# Patient Record
Sex: Male | Born: 1935 | Race: White | Hispanic: No | Marital: Married | Smoking: Former smoker
Health system: Southern US, Community
[De-identification: ages and names within clinical notes are randomized; demographics above are authoritative.]

## PROBLEM LIST (undated history)

## (undated) DIAGNOSIS — G309 Alzheimer's disease, unspecified: Secondary | ICD-10-CM

## (undated) DIAGNOSIS — F0280 Dementia in other diseases classified elsewhere without behavioral disturbance: Secondary | ICD-10-CM

## (undated) DIAGNOSIS — F0390 Unspecified dementia without behavioral disturbance: Secondary | ICD-10-CM

## (undated) DIAGNOSIS — I10 Essential (primary) hypertension: Secondary | ICD-10-CM

## (undated) DIAGNOSIS — A0472 Enterocolitis due to Clostridium difficile, not specified as recurrent: Secondary | ICD-10-CM

## (undated) DIAGNOSIS — K5790 Diverticulosis of intestine, part unspecified, without perforation or abscess without bleeding: Secondary | ICD-10-CM

## (undated) DIAGNOSIS — E7800 Pure hypercholesterolemia, unspecified: Secondary | ICD-10-CM

## (undated) DIAGNOSIS — I2510 Atherosclerotic heart disease of native coronary artery without angina pectoris: Secondary | ICD-10-CM

## (undated) HISTORY — DX: Essential (primary) hypertension: I10

## (undated) HISTORY — PX: CHOLECYSTECTOMY: SHX55

## (undated) HISTORY — PX: TOTAL HIP ARTHROPLASTY: SHX124

## (undated) HISTORY — DX: Atherosclerotic heart disease of native coronary artery without angina pectoris: I25.10

## (undated) HISTORY — DX: Pure hypercholesterolemia, unspecified: E78.00

## (undated) HISTORY — DX: Enterocolitis due to Clostridium difficile, not specified as recurrent: A04.72

## (undated) HISTORY — DX: Unspecified dementia, unspecified severity, without behavioral disturbance, psychotic disturbance, mood disturbance, and anxiety: F03.90

## (undated) HISTORY — DX: Dementia in other diseases classified elsewhere, unspecified severity, without behavioral disturbance, psychotic disturbance, mood disturbance, and anxiety: F02.80

## (undated) HISTORY — DX: Diverticulosis of intestine, part unspecified, without perforation or abscess without bleeding: K57.90

## (undated) HISTORY — DX: Alzheimer's disease, unspecified: G30.9

---

## 2000-11-25 HISTORY — PX: CORONARY ARTERY BYPASS GRAFT: SHX141

## 2000-12-08 ENCOUNTER — Encounter: Payer: Self-pay | Admitting: Surgery

## 2000-12-08 ENCOUNTER — Inpatient Hospital Stay (HOSPITAL_COMMUNITY): Admission: EM | Admit: 2000-12-08 | Discharge: 2000-12-14 | Payer: Self-pay | Admitting: *Deleted

## 2000-12-08 ENCOUNTER — Encounter: Payer: Self-pay | Admitting: *Deleted

## 2000-12-09 ENCOUNTER — Encounter: Payer: Self-pay | Admitting: Cardiothoracic Surgery

## 2000-12-10 ENCOUNTER — Encounter: Payer: Self-pay | Admitting: Surgery

## 2000-12-11 ENCOUNTER — Encounter: Payer: Self-pay | Admitting: Cardiothoracic Surgery

## 2000-12-12 ENCOUNTER — Encounter: Payer: Self-pay | Admitting: Surgery

## 2000-12-26 ENCOUNTER — Inpatient Hospital Stay (HOSPITAL_COMMUNITY): Admission: AD | Admit: 2000-12-26 | Discharge: 2000-12-29 | Payer: Self-pay | Admitting: *Deleted

## 2000-12-26 ENCOUNTER — Encounter: Payer: Self-pay | Admitting: *Deleted

## 2001-09-05 ENCOUNTER — Encounter (HOSPITAL_COMMUNITY): Admission: RE | Admit: 2001-09-05 | Discharge: 2001-09-29 | Payer: Self-pay | Admitting: Psychiatry

## 2002-03-23 ENCOUNTER — Encounter: Admission: RE | Admit: 2002-03-23 | Discharge: 2002-06-21 | Payer: Self-pay

## 2002-03-26 ENCOUNTER — Ambulatory Visit (HOSPITAL_COMMUNITY): Admission: RE | Admit: 2002-03-26 | Discharge: 2002-03-26 | Payer: Self-pay

## 2002-11-07 ENCOUNTER — Ambulatory Visit (HOSPITAL_COMMUNITY): Admission: RE | Admit: 2002-11-07 | Discharge: 2002-11-07 | Payer: Self-pay | Admitting: Orthopedic Surgery

## 2002-11-07 ENCOUNTER — Encounter: Payer: Self-pay | Admitting: Orthopedic Surgery

## 2002-11-28 ENCOUNTER — Inpatient Hospital Stay (HOSPITAL_COMMUNITY): Admission: RE | Admit: 2002-11-28 | Discharge: 2002-12-02 | Payer: Self-pay | Admitting: Orthopedic Surgery

## 2002-11-28 ENCOUNTER — Encounter: Payer: Self-pay | Admitting: Orthopedic Surgery

## 2003-02-11 ENCOUNTER — Encounter: Admission: RE | Admit: 2003-02-11 | Discharge: 2003-02-11 | Payer: Self-pay | Admitting: Internal Medicine

## 2003-02-11 ENCOUNTER — Encounter: Payer: Self-pay | Admitting: Internal Medicine

## 2003-05-04 ENCOUNTER — Inpatient Hospital Stay (HOSPITAL_COMMUNITY): Admission: EM | Admit: 2003-05-04 | Discharge: 2003-05-06 | Payer: Self-pay | Admitting: Emergency Medicine

## 2003-05-05 ENCOUNTER — Encounter: Payer: Self-pay | Admitting: Internal Medicine

## 2003-10-05 ENCOUNTER — Emergency Department (HOSPITAL_COMMUNITY): Admission: EM | Admit: 2003-10-05 | Discharge: 2003-10-05 | Payer: Self-pay | Admitting: Emergency Medicine

## 2008-04-09 ENCOUNTER — Inpatient Hospital Stay (HOSPITAL_COMMUNITY): Admission: AD | Admit: 2008-04-09 | Discharge: 2008-04-14 | Payer: Self-pay | Admitting: Emergency Medicine

## 2008-04-10 ENCOUNTER — Encounter (INDEPENDENT_AMBULATORY_CARE_PROVIDER_SITE_OTHER): Payer: Self-pay | Admitting: Internal Medicine

## 2008-04-10 ENCOUNTER — Ambulatory Visit: Payer: Self-pay | Admitting: Vascular Surgery

## 2008-05-03 LAB — LIPID PANEL
LDL Cholesterol: 41 mg/dL
Triglycerides: 71 mg/dL (ref 40–160)

## 2009-02-17 ENCOUNTER — Inpatient Hospital Stay (HOSPITAL_COMMUNITY): Admission: EM | Admit: 2009-02-17 | Discharge: 2009-02-21 | Payer: Self-pay | Admitting: Emergency Medicine

## 2009-02-20 ENCOUNTER — Ambulatory Visit: Payer: Self-pay | Admitting: Physical Medicine & Rehabilitation

## 2009-02-21 ENCOUNTER — Inpatient Hospital Stay (HOSPITAL_COMMUNITY)
Admission: RE | Admit: 2009-02-21 | Discharge: 2009-03-03 | Payer: Self-pay | Admitting: Physical Medicine & Rehabilitation

## 2009-02-21 ENCOUNTER — Ambulatory Visit: Payer: Self-pay | Admitting: Physical Medicine & Rehabilitation

## 2009-03-10 ENCOUNTER — Ambulatory Visit: Payer: Self-pay | Admitting: Diagnostic Radiology

## 2009-03-10 ENCOUNTER — Emergency Department (HOSPITAL_BASED_OUTPATIENT_CLINIC_OR_DEPARTMENT_OTHER): Admission: EM | Admit: 2009-03-10 | Discharge: 2009-03-10 | Payer: Self-pay | Admitting: Emergency Medicine

## 2009-03-12 ENCOUNTER — Inpatient Hospital Stay (HOSPITAL_COMMUNITY): Admission: AD | Admit: 2009-03-12 | Discharge: 2009-03-25 | Payer: Self-pay | Admitting: Internal Medicine

## 2009-03-13 ENCOUNTER — Encounter (HOSPITAL_BASED_OUTPATIENT_CLINIC_OR_DEPARTMENT_OTHER): Payer: Self-pay | Admitting: Internal Medicine

## 2009-03-17 ENCOUNTER — Ambulatory Visit: Payer: Self-pay | Admitting: Internal Medicine

## 2009-04-21 ENCOUNTER — Inpatient Hospital Stay (HOSPITAL_COMMUNITY): Admission: AD | Admit: 2009-04-21 | Discharge: 2009-04-28 | Payer: Self-pay | Admitting: Internal Medicine

## 2010-01-07 ENCOUNTER — Encounter: Admission: RE | Admit: 2010-01-07 | Discharge: 2010-03-11 | Payer: Self-pay | Admitting: Internal Medicine

## 2010-07-01 IMAGING — US US RENAL
1 series · 14 of 21 positions shown · non-contrast
Comparison: None

CLINICAL DATA: 87-year-old with dehydration and diarrhea.

RENAL/URINARY TRACT ULTRASOUND COMPLETE

[Series 1: us renal · 0.32mm/px · 14 of 21 slices shown]
[im 1/21]
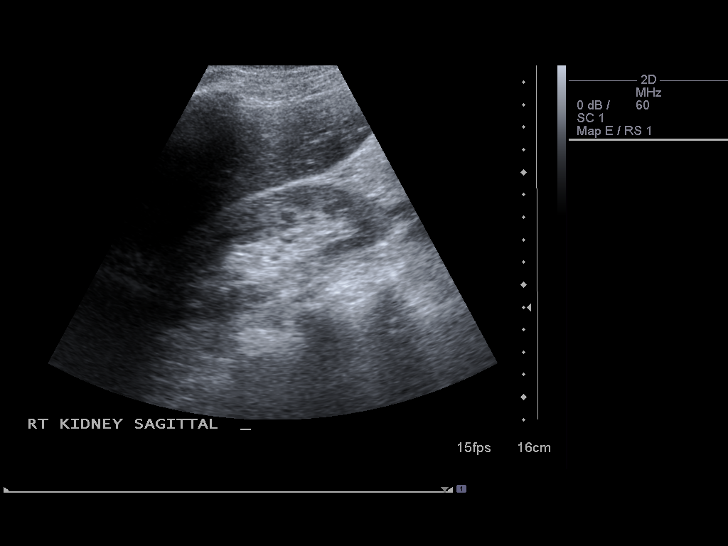
[im 3/21]
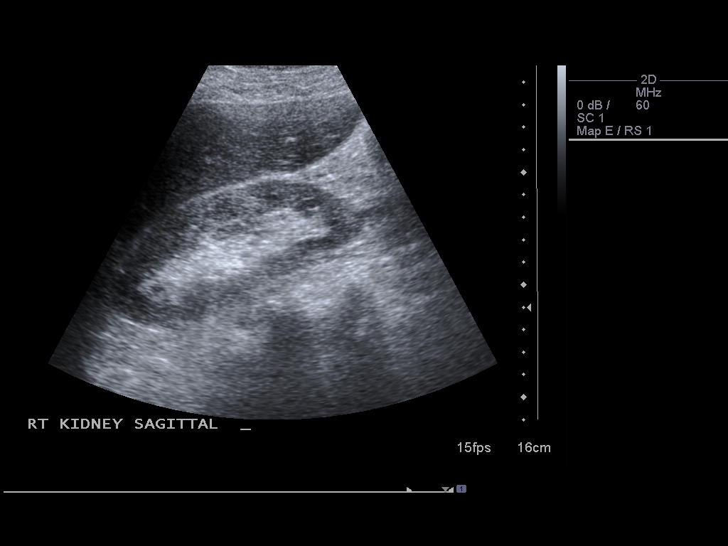
[im 4/21]
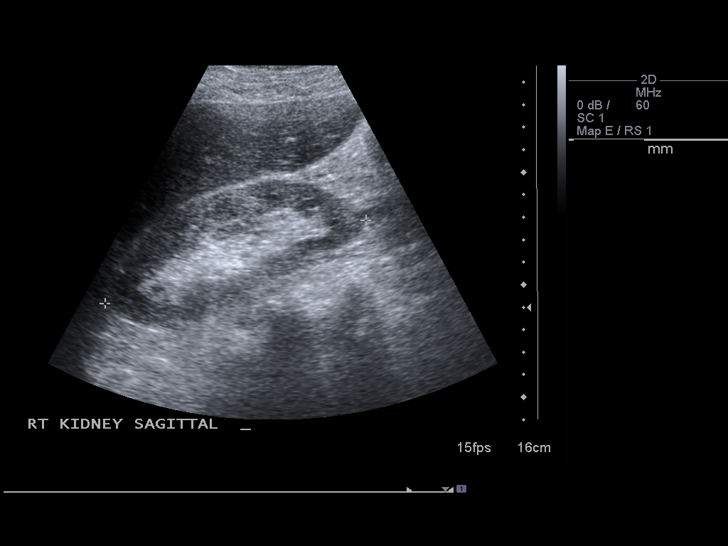
[im 6/21]
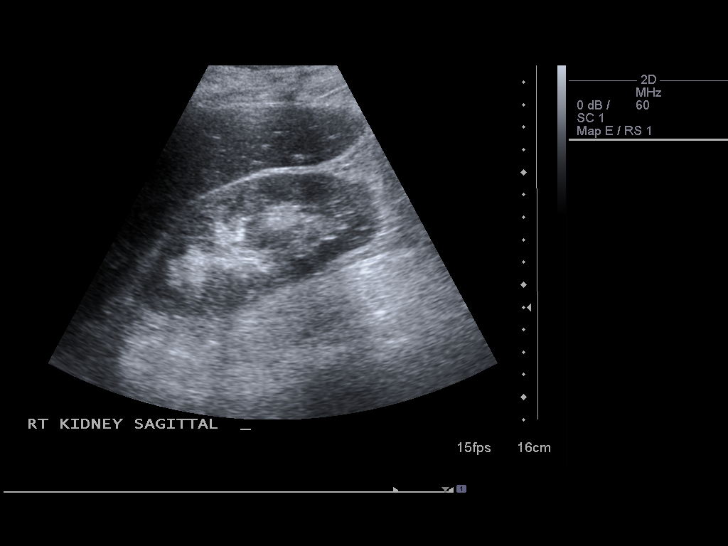
[im 7/21]
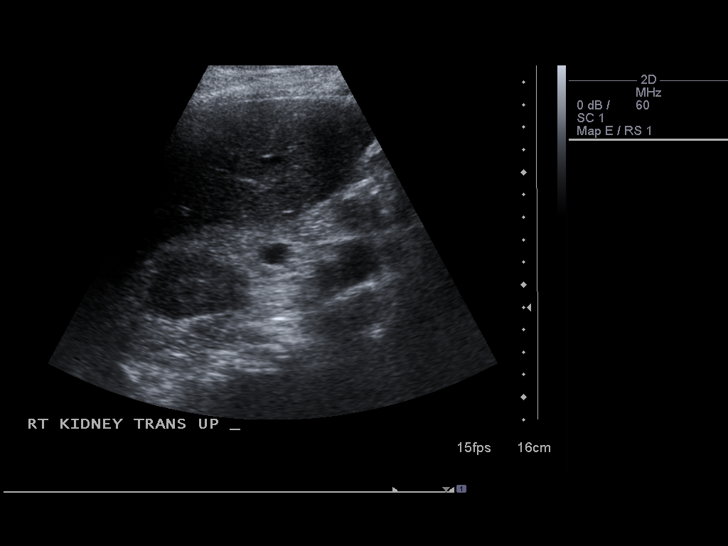
[im 9/21]
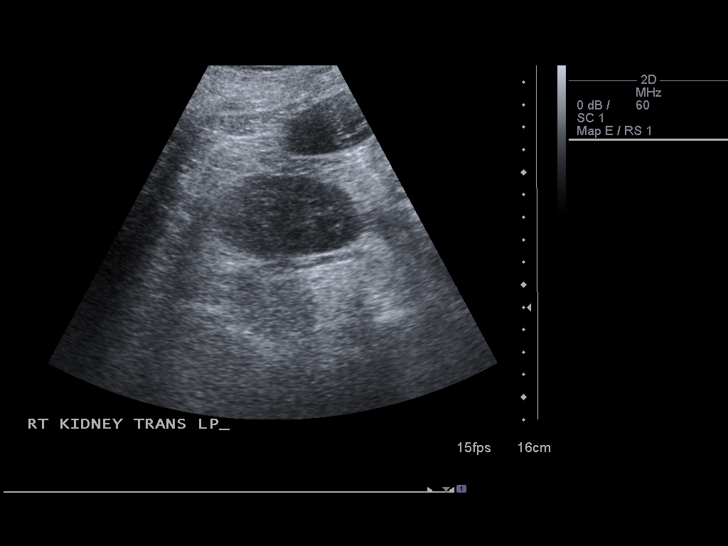
[im 10/21]
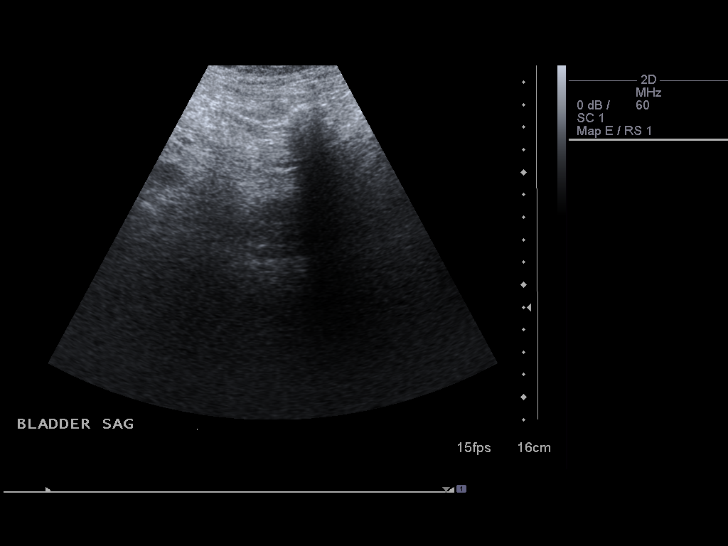
[im 12/21]
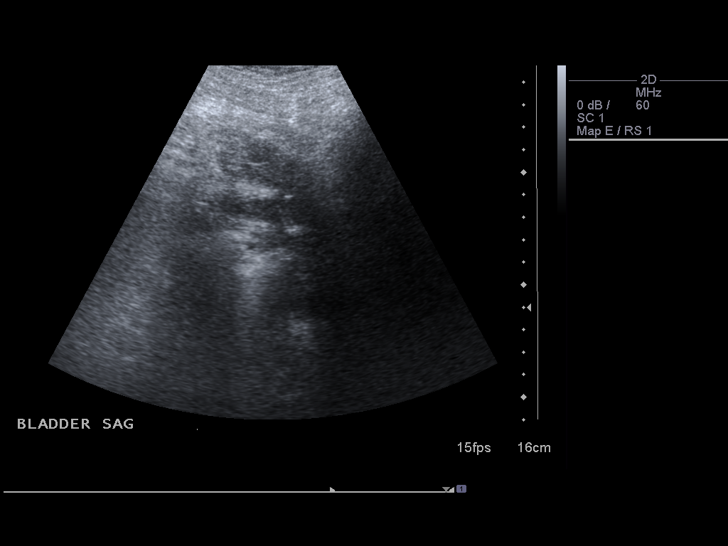
[im 13/21]
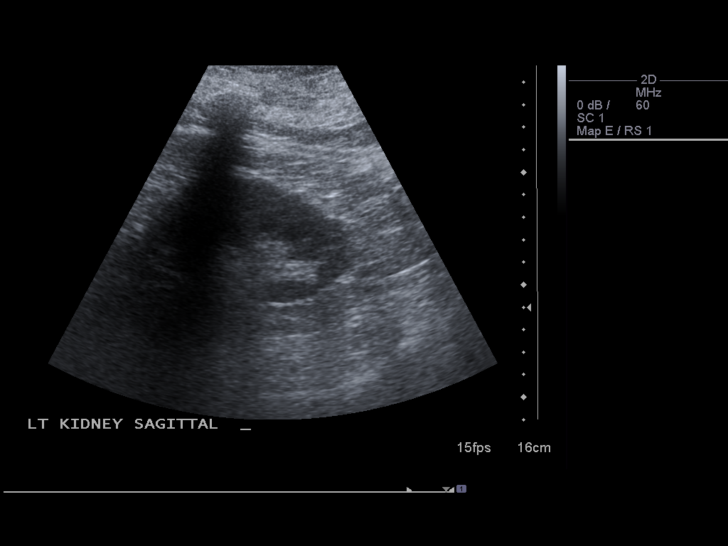
[im 15/21]
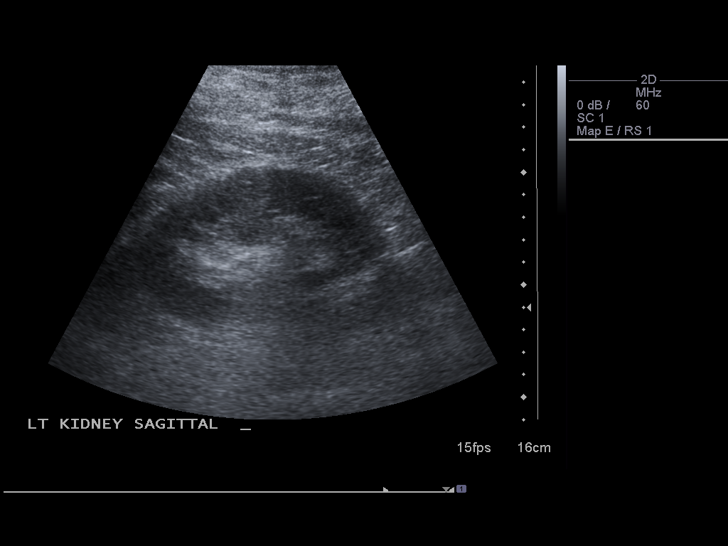
[im 16/21]
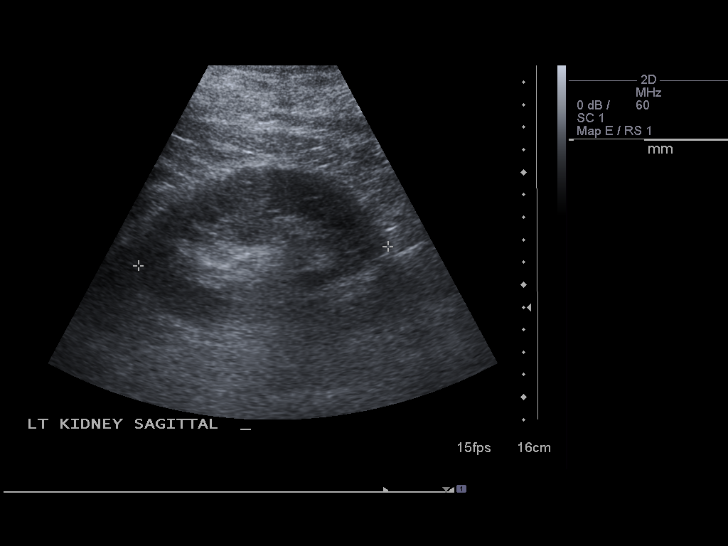
[im 18/21]
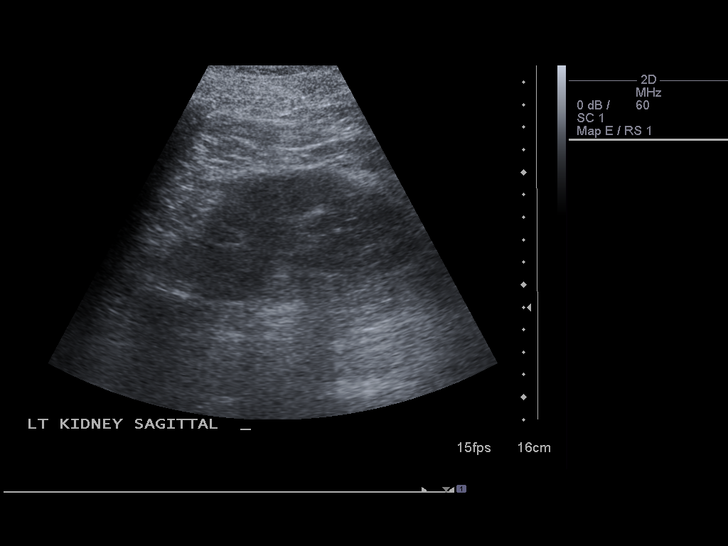
[im 19/21]
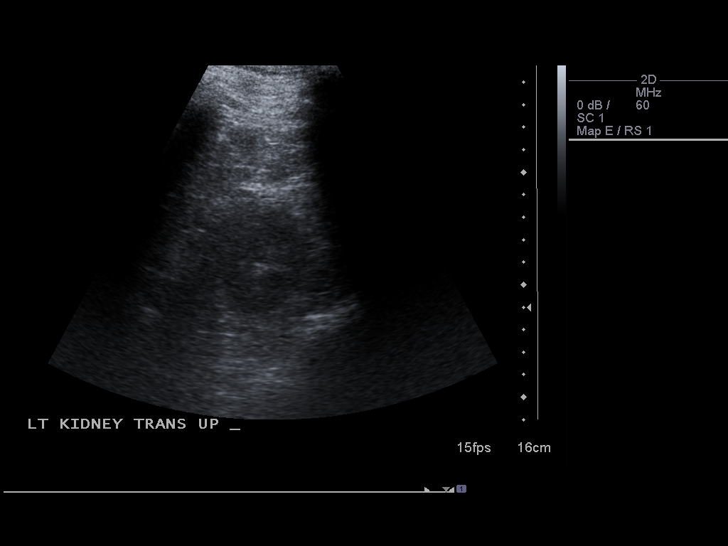
[im 21/21]
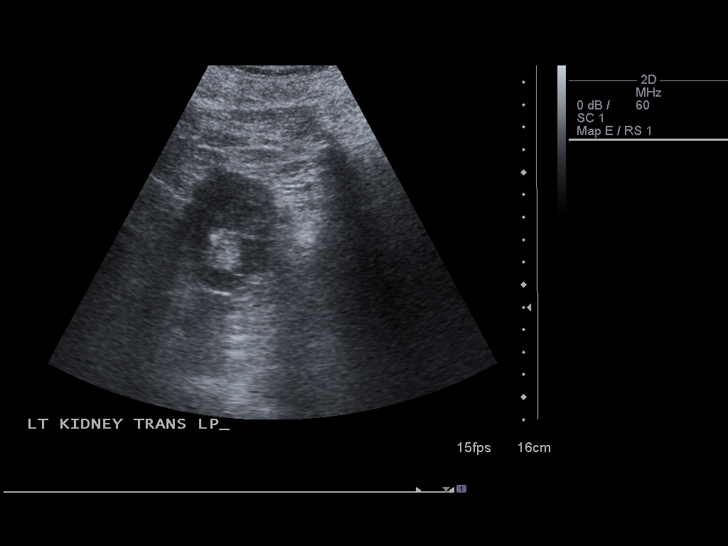

[14 of 21 positions shown; findings below may reference images not displayed]

FINDINGS: Right Kidney:  Right kidney measures 12.2 cm in length.
Echotexture of the right kidney is grossly normal with slight
enlargement of the central sinus complex.  There is no evidence for
hydronephrosis.

Left Kidney:  Left kidney has a normal appearance measuring 11.1 cm
length without hydronephrosis.

Bladder:  The bladder is decompressed.
IMPRESSION: Negative renal ultrasound.  No evidence for hydronephrosis.

## 2010-11-13 ENCOUNTER — Encounter (INDEPENDENT_AMBULATORY_CARE_PROVIDER_SITE_OTHER): Payer: Medicare Other | Admitting: Cardiology

## 2010-11-13 DIAGNOSIS — I2510 Atherosclerotic heart disease of native coronary artery without angina pectoris: Secondary | ICD-10-CM

## 2010-11-13 DIAGNOSIS — I10 Essential (primary) hypertension: Secondary | ICD-10-CM

## 2010-11-13 DIAGNOSIS — E7800 Pure hypercholesterolemia, unspecified: Secondary | ICD-10-CM

## 2010-12-02 ENCOUNTER — Encounter: Payer: Self-pay | Admitting: Cardiology

## 2010-12-02 DIAGNOSIS — K5790 Diverticulosis of intestine, part unspecified, without perforation or abscess without bleeding: Secondary | ICD-10-CM | POA: Insufficient documentation

## 2010-12-02 DIAGNOSIS — F0390 Unspecified dementia without behavioral disturbance: Secondary | ICD-10-CM | POA: Insufficient documentation

## 2010-12-02 DIAGNOSIS — I2510 Atherosclerotic heart disease of native coronary artery without angina pectoris: Secondary | ICD-10-CM | POA: Insufficient documentation

## 2010-12-02 DIAGNOSIS — A0472 Enterocolitis due to Clostridium difficile, not specified as recurrent: Secondary | ICD-10-CM | POA: Insufficient documentation

## 2010-12-02 DIAGNOSIS — E7800 Pure hypercholesterolemia, unspecified: Secondary | ICD-10-CM | POA: Insufficient documentation

## 2010-12-02 DIAGNOSIS — G309 Alzheimer's disease, unspecified: Secondary | ICD-10-CM | POA: Insufficient documentation

## 2010-12-02 DIAGNOSIS — I10 Essential (primary) hypertension: Secondary | ICD-10-CM | POA: Insufficient documentation

## 2010-12-03 ENCOUNTER — Encounter: Payer: Self-pay | Admitting: Cardiology

## 2011-01-02 LAB — CBC
HCT: 33.2 % — ABNORMAL LOW (ref 39.0–52.0)
MCV: 94 fL (ref 78.0–100.0)
Platelets: 258 10*3/uL (ref 150–400)
RDW: 15.1 % (ref 11.5–15.5)

## 2011-01-02 LAB — DIFFERENTIAL
Lymphocytes Relative: 33 % (ref 12–46)
Monocytes Absolute: 0.8 10*3/uL (ref 0.1–1.0)
Monocytes Relative: 12 % (ref 3–12)
Neutro Abs: 3.4 10*3/uL (ref 1.7–7.7)

## 2011-01-02 LAB — COMPREHENSIVE METABOLIC PANEL
Albumin: 2.9 g/dL — ABNORMAL LOW (ref 3.5–5.2)
BUN: 6 mg/dL (ref 6–23)
Calcium: 9.3 mg/dL (ref 8.4–10.5)
Creatinine, Ser: 0.83 mg/dL (ref 0.4–1.5)
Total Protein: 5.9 g/dL — ABNORMAL LOW (ref 6.0–8.3)

## 2011-01-03 LAB — DIFFERENTIAL
Basophils Absolute: 0 10*3/uL (ref 0.0–0.1)
Basophils Absolute: 0 10*3/uL (ref 0.0–0.1)
Basophils Relative: 0 % (ref 0–1)
Eosinophils Absolute: 0.2 10*3/uL (ref 0.0–0.7)
Eosinophils Absolute: 0.2 10*3/uL (ref 0.0–0.7)
Eosinophils Relative: 3 % (ref 0–5)
Eosinophils Relative: 4 % (ref 0–5)
Lymphocytes Relative: 23 % (ref 12–46)
Monocytes Absolute: 0.7 10*3/uL (ref 0.1–1.0)

## 2011-01-03 LAB — BASIC METABOLIC PANEL
BUN: 6 mg/dL (ref 6–23)
CO2: 26 mEq/L (ref 19–32)
Calcium: 9.1 mg/dL (ref 8.4–10.5)
GFR calc non Af Amer: >60 mL/min (ref >60)
Glucose, Bld: 94 mg/dL (ref 70–99)
Potassium: 3.7 mEq/L (ref 3.5–5.1)

## 2011-01-03 LAB — COMPREHENSIVE METABOLIC PANEL
ALT: <8 U/L (ref 0–53)
AST: 18 U/L (ref 0–37)
AST: 21 U/L (ref 0–37)
Albumin: 2.7 g/dL — ABNORMAL LOW (ref 3.5–5.2)
Alkaline Phosphatase: 95 U/L (ref 39–117)
CO2: 29 mEq/L (ref 19–32)
CO2: 29 mEq/L (ref 19–32)
Chloride: 108 mEq/L (ref 96–112)
Chloride: 108 mEq/L (ref 96–112)
Creatinine, Ser: 0.88 mg/dL (ref 0.4–1.5)
GFR calc Af Amer: >60 mL/min (ref >60)
GFR calc Af Amer: >60 mL/min (ref >60)
GFR calc non Af Amer: >60 mL/min (ref >60)
GFR calc non Af Amer: >60 mL/min (ref >60)
Potassium: 4.5 mEq/L (ref 3.5–5.1)
Sodium: 141 mEq/L (ref 135–145)
Total Bilirubin: 0.7 mg/dL (ref 0.3–1.2)
Total Bilirubin: 0.8 mg/dL (ref 0.3–1.2)

## 2011-01-03 LAB — CBC
HCT: 33 % — ABNORMAL LOW (ref 39.0–52.0)
MCV: 95.2 fL (ref 78.0–100.0)
RBC: 3.36 MIL/uL — ABNORMAL LOW (ref 4.22–5.81)
RBC: 3.46 MIL/uL — ABNORMAL LOW (ref 4.22–5.81)
WBC: 5.6 10*3/uL (ref 4.0–10.5)
WBC: 8.3 10*3/uL (ref 4.0–10.5)

## 2011-01-03 LAB — MAGNESIUM
Magnesium: 2.1 mg/dL (ref 1.5–2.5)
Magnesium: 2.1 mg/dL (ref 1.5–2.5)

## 2011-01-03 LAB — CULTURE, ROUTINE-ABSCESS

## 2011-01-04 LAB — COMPREHENSIVE METABOLIC PANEL
ALT: 10 U/L (ref 0–53)
ALT: 11 U/L (ref 0–53)
ALT: 11 U/L (ref 0–53)
ALT: 13 U/L (ref 0–53)
ALT: 13 U/L (ref 0–53)
ALT: 14 U/L (ref 0–53)
ALT: 15 U/L (ref 0–53)
ALT: 15 U/L (ref 0–53)
ALT: 17 U/L (ref 0–53)
AST: 16 U/L (ref 0–37)
AST: 17 U/L (ref 0–37)
AST: 18 U/L (ref 0–37)
AST: 21 U/L (ref 0–37)
AST: 23 U/L (ref 0–37)
AST: 24 U/L (ref 0–37)
AST: 25 U/L (ref 0–37)
AST: 26 U/L (ref 0–37)
AST: 28 U/L (ref 0–37)
Albumin: 1.6 g/dL — ABNORMAL LOW (ref 3.5–5.2)
Albumin: 1.9 g/dL — ABNORMAL LOW (ref 3.5–5.2)
Albumin: 2 g/dL — ABNORMAL LOW (ref 3.5–5.2)
Albumin: 2.1 g/dL — ABNORMAL LOW (ref 3.5–5.2)
Albumin: 2.1 g/dL — ABNORMAL LOW (ref 3.5–5.2)
Albumin: 2.3 g/dL — ABNORMAL LOW (ref 3.5–5.2)
Albumin: 2.5 g/dL — ABNORMAL LOW (ref 3.5–5.2)
Albumin: 2.6 g/dL — ABNORMAL LOW (ref 3.5–5.2)
Alkaline Phosphatase: 110 U/L (ref 39–117)
Alkaline Phosphatase: 63 U/L (ref 39–117)
Alkaline Phosphatase: 72 U/L (ref 39–117)
Alkaline Phosphatase: 73 U/L (ref 39–117)
Alkaline Phosphatase: 74 U/L (ref 39–117)
Alkaline Phosphatase: 75 U/L (ref 39–117)
Alkaline Phosphatase: 78 U/L (ref 39–117)
Alkaline Phosphatase: 80 U/L (ref 39–117)
Alkaline Phosphatase: 88 U/L (ref 39–117)
Alkaline Phosphatase: 89 U/L (ref 39–117)
Alkaline Phosphatase: 92 U/L (ref 39–117)
Alkaline Phosphatase: 70 U/L (ref 39–117)
BUN: 17 mg/dL (ref 6–23)
BUN: 21 mg/dL (ref 6–23)
BUN: 22 mg/dL (ref 6–23)
BUN: 22 mg/dL (ref 6–23)
BUN: 4 mg/dL — ABNORMAL LOW (ref 6–23)
BUN: 5 mg/dL — ABNORMAL LOW (ref 6–23)
BUN: 5 mg/dL — ABNORMAL LOW (ref 6–23)
BUN: 6 mg/dL (ref 6–23)
BUN: 13 mg/dL (ref 6–23)
CO2: 21 mEq/L (ref 19–32)
CO2: 24 mEq/L (ref 19–32)
CO2: 24 mEq/L (ref 19–32)
CO2: 24 mEq/L (ref 19–32)
CO2: 26 mEq/L (ref 19–32)
CO2: 27 mEq/L (ref 19–32)
CO2: 27 mEq/L (ref 19–32)
CO2: 27 mEq/L (ref 19–32)
CO2: 28 mEq/L (ref 19–32)
CO2: 39 mEq/L — ABNORMAL HIGH (ref 19–32)
Calcium: 5.7 mg/dL — CL (ref 8.4–10.5)
Calcium: 6.8 mg/dL — ABNORMAL LOW (ref 8.4–10.5)
Calcium: 7.4 mg/dL — ABNORMAL LOW (ref 8.4–10.5)
Calcium: 7.8 mg/dL — ABNORMAL LOW (ref 8.4–10.5)
Calcium: 7.9 mg/dL — ABNORMAL LOW (ref 8.4–10.5)
Calcium: 8.2 mg/dL — ABNORMAL LOW (ref 8.4–10.5)
Calcium: 8.3 mg/dL — ABNORMAL LOW (ref 8.4–10.5)
Calcium: 8.8 mg/dL (ref 8.4–10.5)
Calcium: 8.8 mg/dL (ref 8.4–10.5)
Calcium: 8.6 mg/dL (ref 8.4–10.5)
Chloride: 103 mEq/L (ref 96–112)
Chloride: 103 mEq/L (ref 96–112)
Chloride: 104 mEq/L (ref 96–112)
Chloride: 105 mEq/L (ref 96–112)
Chloride: 105 mEq/L (ref 96–112)
Chloride: 105 mEq/L (ref 96–112)
Chloride: 107 mEq/L (ref 96–112)
Chloride: 107 mEq/L (ref 96–112)
Chloride: 110 mEq/L (ref 96–112)
Chloride: 110 mEq/L (ref 96–112)
Chloride: 91 mEq/L — ABNORMAL LOW (ref 96–112)
Creatinine, Ser: 0.93 mg/dL (ref 0.4–1.5)
Creatinine, Ser: 0.96 mg/dL (ref 0.4–1.5)
Creatinine, Ser: 1.13 mg/dL (ref 0.4–1.5)
Creatinine, Ser: 1.82 mg/dL — ABNORMAL HIGH (ref 0.4–1.5)
Creatinine, Ser: 2.51 mg/dL — ABNORMAL HIGH (ref 0.4–1.5)
Creatinine, Ser: 5.84 mg/dL — ABNORMAL HIGH (ref 0.4–1.5)
Creatinine, Ser: 6.32 mg/dL — ABNORMAL HIGH (ref 0.4–1.5)
Creatinine, Ser: 6.93 mg/dL — ABNORMAL HIGH (ref 0.4–1.5)
Creatinine, Ser: 1.08 mg/dL (ref 0.4–1.5)
GFR calc Af Amer: 10 mL/min — ABNORMAL LOW (ref >60)
GFR calc Af Amer: 11 mL/min — ABNORMAL LOW (ref >60)
GFR calc Af Amer: 19 mL/min — ABNORMAL LOW (ref >60)
GFR calc Af Amer: 30 mL/min — ABNORMAL LOW (ref >60)
GFR calc Af Amer: 43 mL/min — ABNORMAL LOW (ref >60)
GFR calc Af Amer: 9 mL/min — ABNORMAL LOW (ref >60)
GFR calc Af Amer: 9 mL/min — ABNORMAL LOW (ref >60)
GFR calc Af Amer: >60 mL/min (ref >60)
GFR calc Af Amer: >60 mL/min (ref >60)
GFR calc Af Amer: >60 mL/min (ref >60)
GFR calc Af Amer: >60 mL/min (ref >60)
GFR calc non Af Amer: 16 mL/min — ABNORMAL LOW (ref >60)
GFR calc non Af Amer: 25 mL/min — ABNORMAL LOW (ref >60)
GFR calc non Af Amer: 52 mL/min — ABNORMAL LOW (ref >60)
GFR calc non Af Amer: 8 mL/min — ABNORMAL LOW (ref >60)
GFR calc non Af Amer: 8 mL/min — ABNORMAL LOW (ref >60)
GFR calc non Af Amer: 8 mL/min — ABNORMAL LOW (ref >60)
GFR calc non Af Amer: 9 mL/min — ABNORMAL LOW (ref >60)
GFR calc non Af Amer: >60 mL/min (ref >60)
GFR calc non Af Amer: >60 mL/min (ref >60)
GFR calc non Af Amer: >60 mL/min (ref >60)
GFR calc non Af Amer: >60 mL/min (ref >60)
Glucose, Bld: 100 mg/dL — ABNORMAL HIGH (ref 70–99)
Glucose, Bld: 103 mg/dL — ABNORMAL HIGH (ref 70–99)
Glucose, Bld: 108 mg/dL — ABNORMAL HIGH (ref 70–99)
Glucose, Bld: 108 mg/dL — ABNORMAL HIGH (ref 70–99)
Glucose, Bld: 110 mg/dL — ABNORMAL HIGH (ref 70–99)
Glucose, Bld: 113 mg/dL — ABNORMAL HIGH (ref 70–99)
Glucose, Bld: 131 mg/dL — ABNORMAL HIGH (ref 70–99)
Glucose, Bld: 138 mg/dL — ABNORMAL HIGH (ref 70–99)
Glucose, Bld: 151 mg/dL — ABNORMAL HIGH (ref 70–99)
Glucose, Bld: 670 mg/dL (ref 70–99)
Glucose, Bld: 95 mg/dL (ref 70–99)
Glucose, Bld: 118 mg/dL — ABNORMAL HIGH (ref 70–99)
Potassium: 2 mEq/L — CL (ref 3.5–5.1)
Potassium: 2.7 mEq/L — CL (ref 3.5–5.1)
Potassium: 2.8 mEq/L — ABNORMAL LOW (ref 3.5–5.1)
Potassium: 2.8 mEq/L — ABNORMAL LOW (ref 3.5–5.1)
Potassium: 2.8 mEq/L — ABNORMAL LOW (ref 3.5–5.1)
Potassium: 2.9 mEq/L — ABNORMAL LOW (ref 3.5–5.1)
Potassium: 2.9 mEq/L — ABNORMAL LOW (ref 3.5–5.1)
Potassium: 3.2 mEq/L — ABNORMAL LOW (ref 3.5–5.1)
Potassium: 3.4 mEq/L — ABNORMAL LOW (ref 3.5–5.1)
Potassium: 3.9 mEq/L (ref 3.5–5.1)
Potassium: 4 mEq/L (ref 3.5–5.1)
Potassium: 4.1 mEq/L (ref 3.5–5.1)
Sodium: 135 mEq/L (ref 135–145)
Sodium: 136 mEq/L (ref 135–145)
Sodium: 137 mEq/L (ref 135–145)
Sodium: 139 mEq/L (ref 135–145)
Sodium: 139 mEq/L (ref 135–145)
Sodium: 139 mEq/L (ref 135–145)
Sodium: 140 mEq/L (ref 135–145)
Sodium: 140 mEq/L (ref 135–145)
Sodium: 140 mEq/L (ref 135–145)
Sodium: 141 mEq/L (ref 135–145)
Sodium: 143 mEq/L (ref 135–145)
Total Bilirubin: 0.5 mg/dL (ref 0.3–1.2)
Total Bilirubin: 0.5 mg/dL (ref 0.3–1.2)
Total Bilirubin: 0.6 mg/dL (ref 0.3–1.2)
Total Bilirubin: 0.6 mg/dL (ref 0.3–1.2)
Total Bilirubin: 0.7 mg/dL (ref 0.3–1.2)
Total Bilirubin: 0.7 mg/dL (ref 0.3–1.2)
Total Bilirubin: 0.7 mg/dL (ref 0.3–1.2)
Total Bilirubin: 0.8 mg/dL (ref 0.3–1.2)
Total Bilirubin: 0.9 mg/dL (ref 0.3–1.2)
Total Bilirubin: 1 mg/dL (ref 0.3–1.2)
Total Protein: 3.6 g/dL — ABNORMAL LOW (ref 6.0–8.3)
Total Protein: 4.4 g/dL — ABNORMAL LOW (ref 6.0–8.3)
Total Protein: 4.5 g/dL — ABNORMAL LOW (ref 6.0–8.3)
Total Protein: 4.7 g/dL — ABNORMAL LOW (ref 6.0–8.3)
Total Protein: 4.8 g/dL — ABNORMAL LOW (ref 6.0–8.3)
Total Protein: 5 g/dL — ABNORMAL LOW (ref 6.0–8.3)
Total Protein: 5.3 g/dL — ABNORMAL LOW (ref 6.0–8.3)
Total Protein: 5.7 g/dL — ABNORMAL LOW (ref 6.0–8.3)
Total Protein: 5.6 g/dL — ABNORMAL LOW (ref 6.0–8.3)

## 2011-01-04 LAB — BRAIN NATRIURETIC PEPTIDE: Pro B Natriuretic peptide (BNP): 306 pg/mL — ABNORMAL HIGH (ref 0.0–100.0)

## 2011-01-04 LAB — RENAL FUNCTION PANEL
CO2: 16 mEq/L — ABNORMAL LOW (ref 19–32)
CO2: 23 mEq/L (ref 19–32)
Calcium: 7.4 mg/dL — ABNORMAL LOW (ref 8.4–10.5)
Chloride: 105 mEq/L (ref 96–112)
Chloride: 115 mEq/L — ABNORMAL HIGH (ref 96–112)
GFR calc Af Amer: 9 mL/min — ABNORMAL LOW (ref >60)
GFR calc non Af Amer: 7 mL/min — ABNORMAL LOW (ref >60)
Glucose, Bld: 129 mg/dL — ABNORMAL HIGH (ref 70–99)
Glucose, Bld: 153 mg/dL — ABNORMAL HIGH (ref 70–99)
Sodium: 139 mEq/L (ref 135–145)
Sodium: 139 mEq/L (ref 135–145)

## 2011-01-04 LAB — URINALYSIS, MICROSCOPIC ONLY
Glucose, UA: NEGATIVE mg/dL
Protein, ur: >300 mg/dL — AB
Specific Gravity, Urine: 1.015 (ref 1.005–1.030)
Urobilinogen, UA: 0.2 mg/dL (ref 0.0–1.0)

## 2011-01-04 LAB — DIFFERENTIAL
Basophils Absolute: 0 10*3/uL (ref 0.0–0.1)
Basophils Absolute: 0 10*3/uL (ref 0.0–0.1)
Basophils Absolute: 0 10*3/uL (ref 0.0–0.1)
Basophils Absolute: 0 10*3/uL (ref 0.0–0.1)
Basophils Absolute: 0.1 10*3/uL (ref 0.0–0.1)
Basophils Absolute: 0 10*3/uL (ref 0.0–0.1)
Basophils Relative: 0 % (ref 0–1)
Basophils Relative: 0 % (ref 0–1)
Basophils Relative: 0 % (ref 0–1)
Basophils Relative: 0 % (ref 0–1)
Basophils Relative: 0 % (ref 0–1)
Basophils Relative: 1 % (ref 0–1)
Basophils Relative: 1 % (ref 0–1)
Basophils Relative: 0 % (ref 0–1)
Eosinophils Absolute: 0.2 10*3/uL (ref 0.0–0.7)
Eosinophils Absolute: 0.2 10*3/uL (ref 0.0–0.7)
Eosinophils Absolute: 0.2 10*3/uL (ref 0.0–0.7)
Eosinophils Absolute: 0.2 10*3/uL (ref 0.0–0.7)
Eosinophils Absolute: 0.2 10*3/uL (ref 0.0–0.7)
Eosinophils Absolute: 0.3 10*3/uL (ref 0.0–0.7)
Eosinophils Relative: 2 % (ref 0–5)
Eosinophils Relative: 2 % (ref 0–5)
Eosinophils Relative: 2 % (ref 0–5)
Eosinophils Relative: 3 % (ref 0–5)
Eosinophils Relative: 3 % (ref 0–5)
Eosinophils Relative: 3 % (ref 0–5)
Eosinophils Relative: 4 % (ref 0–5)
Lymphocytes Relative: 10 % — ABNORMAL LOW (ref 12–46)
Lymphocytes Relative: 13 % (ref 12–46)
Lymphocytes Relative: 14 % (ref 12–46)
Lymphocytes Relative: 17 % (ref 12–46)
Lymphocytes Relative: 22 % (ref 12–46)
Lymphocytes Relative: 9 % — ABNORMAL LOW (ref 12–46)
Lymphocytes Relative: 9 % — ABNORMAL LOW (ref 12–46)
Lymphs Abs: 0.8 10*3/uL (ref 0.7–4.0)
Lymphs Abs: 0.9 10*3/uL (ref 0.7–4.0)
Lymphs Abs: 1.1 10*3/uL (ref 0.7–4.0)
Lymphs Abs: 1.2 10*3/uL (ref 0.7–4.0)
Lymphs Abs: 1.3 10*3/uL (ref 0.7–4.0)
Lymphs Abs: 1.3 10*3/uL (ref 0.7–4.0)
Lymphs Abs: 1.3 10*3/uL (ref 0.7–4.0)
Lymphs Abs: 1.5 10*3/uL (ref 0.7–4.0)
Lymphs Abs: 1.5 10*3/uL (ref 0.7–4.0)
Lymphs Abs: 1.6 10*3/uL (ref 0.7–4.0)
Monocytes Absolute: 0.6 10*3/uL (ref 0.1–1.0)
Monocytes Absolute: 0.8 10*3/uL (ref 0.1–1.0)
Monocytes Absolute: 0.8 10*3/uL (ref 0.1–1.0)
Monocytes Absolute: 0.9 10*3/uL (ref 0.1–1.0)
Monocytes Absolute: 0.9 10*3/uL (ref 0.1–1.0)
Monocytes Relative: 11 % (ref 3–12)
Monocytes Relative: 14 % — ABNORMAL HIGH (ref 3–12)
Monocytes Relative: 7 % (ref 3–12)
Monocytes Relative: 9 % (ref 3–12)
Monocytes Relative: 9 % (ref 3–12)
Monocytes Relative: 9 % (ref 3–12)
Monocytes Relative: 7 % (ref 3–12)
Neutro Abs: 10.4 10*3/uL — ABNORMAL HIGH (ref 1.7–7.7)
Neutro Abs: 2.7 10*3/uL (ref 1.7–7.7)
Neutro Abs: 5.2 10*3/uL (ref 1.7–7.7)
Neutro Abs: 6.6 10*3/uL (ref 1.7–7.7)
Neutro Abs: 6.8 10*3/uL (ref 1.7–7.7)
Neutro Abs: 7 10*3/uL (ref 1.7–7.7)
Neutro Abs: 7.3 10*3/uL (ref 1.7–7.7)
Neutro Abs: 7.4 10*3/uL (ref 1.7–7.7)
Neutro Abs: 13.6 10*3/uL — ABNORMAL HIGH (ref 1.7–7.7)
Neutrophils Relative %: 52 % (ref 43–77)
Neutrophils Relative %: 64 % (ref 43–77)
Neutrophils Relative %: 70 % (ref 43–77)
Neutrophils Relative %: 74 % (ref 43–77)
Neutrophils Relative %: 75 % (ref 43–77)
Neutrophils Relative %: 76 % (ref 43–77)
Neutrophils Relative %: 76 % (ref 43–77)
Neutrophils Relative %: 79 % — ABNORMAL HIGH (ref 43–77)
Neutrophils Relative %: 80 % — ABNORMAL HIGH (ref 43–77)

## 2011-01-04 LAB — BASIC METABOLIC PANEL
BUN: 25 mg/dL — ABNORMAL HIGH (ref 6–23)
BUN: 10 mg/dL (ref 6–23)
BUN: 12 mg/dL (ref 6–23)
BUN: 8 mg/dL (ref 6–23)
CO2: 24 mEq/L (ref 19–32)
CO2: 30 mEq/L (ref 19–32)
Calcium: 8.2 mg/dL — ABNORMAL LOW (ref 8.4–10.5)
Calcium: 9.2 mg/dL (ref 8.4–10.5)
Calcium: 8.7 mg/dL (ref 8.4–10.5)
Chloride: 110 mEq/L (ref 96–112)
Chloride: 107 mEq/L (ref 96–112)
Creatinine, Ser: 3 mg/dL — ABNORMAL HIGH (ref 0.4–1.5)
Creatinine, Ser: 7.06 mg/dL — ABNORMAL HIGH (ref 0.4–1.5)
Creatinine, Ser: 0.84 mg/dL (ref 0.4–1.5)
Creatinine, Ser: 0.89 mg/dL (ref 0.4–1.5)
GFR calc Af Amer: 24 mL/min — ABNORMAL LOW (ref >60)
GFR calc Af Amer: >60 mL/min (ref >60)
GFR calc Af Amer: >60 mL/min (ref >60)
GFR calc Af Amer: >60 mL/min (ref >60)
GFR calc non Af Amer: 7 mL/min — ABNORMAL LOW (ref >60)
GFR calc non Af Amer: >60 mL/min (ref >60)
GFR calc non Af Amer: >60 mL/min (ref >60)
GFR calc non Af Amer: >60 mL/min (ref >60)
GFR calc non Af Amer: 58 mL/min — ABNORMAL LOW (ref >60)
Glucose, Bld: 140 mg/dL — ABNORMAL HIGH (ref 70–99)
Glucose, Bld: 95 mg/dL (ref 70–99)
Glucose, Bld: 104 mg/dL — ABNORMAL HIGH (ref 70–99)
Potassium: 3.2 mEq/L — ABNORMAL LOW (ref 3.5–5.1)
Potassium: 3.5 mEq/L (ref 3.5–5.1)
Potassium: 3.5 mEq/L (ref 3.5–5.1)
Sodium: 136 mEq/L (ref 135–145)
Sodium: 144 mEq/L (ref 135–145)
Sodium: 138 mEq/L (ref 135–145)

## 2011-01-04 LAB — CBC
HCT: 26.1 % — ABNORMAL LOW (ref 39.0–52.0)
HCT: 28.1 % — ABNORMAL LOW (ref 39.0–52.0)
HCT: 29.9 % — ABNORMAL LOW (ref 39.0–52.0)
HCT: 30 % — ABNORMAL LOW (ref 39.0–52.0)
HCT: 30.1 % — ABNORMAL LOW (ref 39.0–52.0)
HCT: 31 % — ABNORMAL LOW (ref 39.0–52.0)
HCT: 31.5 % — ABNORMAL LOW (ref 39.0–52.0)
HCT: 34 % — ABNORMAL LOW (ref 39.0–52.0)
HCT: 30.4 % — ABNORMAL LOW (ref 39.0–52.0)
HCT: 31.3 % — ABNORMAL LOW (ref 39.0–52.0)
HCT: 31.4 % — ABNORMAL LOW (ref 39.0–52.0)
Hemoglobin: 10.2 g/dL — ABNORMAL LOW (ref 13.0–17.0)
Hemoglobin: 10.2 g/dL — ABNORMAL LOW (ref 13.0–17.0)
Hemoglobin: 10.2 g/dL — ABNORMAL LOW (ref 13.0–17.0)
Hemoglobin: 10.3 g/dL — ABNORMAL LOW (ref 13.0–17.0)
Hemoglobin: 10.5 g/dL — ABNORMAL LOW (ref 13.0–17.0)
Hemoglobin: 10.6 g/dL — ABNORMAL LOW (ref 13.0–17.0)
Hemoglobin: 10.8 g/dL — ABNORMAL LOW (ref 13.0–17.0)
Hemoglobin: 8.9 g/dL — ABNORMAL LOW (ref 13.0–17.0)
Hemoglobin: 9.9 g/dL — ABNORMAL LOW (ref 13.0–17.0)
Hemoglobin: 10.6 g/dL — ABNORMAL LOW (ref 13.0–17.0)
Hemoglobin: 11.3 g/dL — ABNORMAL LOW (ref 13.0–17.0)
MCHC: 33.6 g/dL (ref 30.0–36.0)
MCHC: 33.8 g/dL (ref 30.0–36.0)
MCHC: 33.9 g/dL (ref 30.0–36.0)
MCHC: 34 g/dL (ref 30.0–36.0)
MCHC: 34 g/dL (ref 30.0–36.0)
MCHC: 34.1 g/dL (ref 30.0–36.0)
MCHC: 34.1 g/dL (ref 30.0–36.0)
MCHC: 34.3 g/dL (ref 30.0–36.0)
MCHC: 34.4 g/dL (ref 30.0–36.0)
MCHC: 34.7 g/dL (ref 30.0–36.0)
MCV: 94.8 fL (ref 78.0–100.0)
MCV: 94.8 fL (ref 78.0–100.0)
MCV: 94.9 fL (ref 78.0–100.0)
MCV: 95.2 fL (ref 78.0–100.0)
MCV: 95.4 fL (ref 78.0–100.0)
MCV: 95.4 fL (ref 78.0–100.0)
MCV: 95.4 fL (ref 78.0–100.0)
MCV: 96.6 fL (ref 78.0–100.0)
MCV: 97.1 fL (ref 78.0–100.0)
Platelets: 230 10*3/uL (ref 150–400)
Platelets: 235 10*3/uL (ref 150–400)
Platelets: 248 10*3/uL (ref 150–400)
Platelets: 251 10*3/uL (ref 150–400)
Platelets: 266 10*3/uL (ref 150–400)
Platelets: 266 10*3/uL (ref 150–400)
Platelets: 269 10*3/uL (ref 150–400)
Platelets: 275 10*3/uL (ref 150–400)
Platelets: 279 10*3/uL (ref 150–400)
Platelets: 293 10*3/uL (ref 150–400)
Platelets: 337 10*3/uL (ref 150–400)
Platelets: 279 10*3/uL (ref 150–400)
Platelets: 324 10*3/uL (ref 150–400)
RBC: 2.74 MIL/uL — ABNORMAL LOW (ref 4.22–5.81)
RBC: 3.08 MIL/uL — ABNORMAL LOW (ref 4.22–5.81)
RBC: 3.15 MIL/uL — ABNORMAL LOW (ref 4.22–5.81)
RBC: 3.15 MIL/uL — ABNORMAL LOW (ref 4.22–5.81)
RBC: 3.16 MIL/uL — ABNORMAL LOW (ref 4.22–5.81)
RBC: 3.16 MIL/uL — ABNORMAL LOW (ref 4.22–5.81)
RBC: 3.22 MIL/uL — ABNORMAL LOW (ref 4.22–5.81)
RBC: 3.25 MIL/uL — ABNORMAL LOW (ref 4.22–5.81)
RBC: 3.26 MIL/uL — ABNORMAL LOW (ref 4.22–5.81)
RBC: 3.26 MIL/uL — ABNORMAL LOW (ref 4.22–5.81)
RBC: 3.27 MIL/uL — ABNORMAL LOW (ref 4.22–5.81)
RBC: 3.22 MIL/uL — ABNORMAL LOW (ref 4.22–5.81)
RBC: 3.35 MIL/uL — ABNORMAL LOW (ref 4.22–5.81)
RDW: 15.1 % (ref 11.5–15.5)
RDW: 15.1 % (ref 11.5–15.5)
RDW: 15.2 % (ref 11.5–15.5)
RDW: 15.2 % (ref 11.5–15.5)
RDW: 15.3 % (ref 11.5–15.5)
RDW: 15.5 % (ref 11.5–15.5)
RDW: 15.8 % — ABNORMAL HIGH (ref 11.5–15.5)
RDW: 14.6 % (ref 11.5–15.5)
WBC: 18.3 10*3/uL — ABNORMAL HIGH (ref 4.0–10.5)
WBC: 21.5 10*3/uL — ABNORMAL HIGH (ref 4.0–10.5)
WBC: 5.2 10*3/uL (ref 4.0–10.5)
WBC: 6.2 10*3/uL (ref 4.0–10.5)
WBC: 7.3 10*3/uL (ref 4.0–10.5)
WBC: 8.7 10*3/uL (ref 4.0–10.5)
WBC: 8.8 10*3/uL (ref 4.0–10.5)
WBC: 8.9 10*3/uL (ref 4.0–10.5)
WBC: 9.2 10*3/uL (ref 4.0–10.5)
WBC: 9.3 10*3/uL (ref 4.0–10.5)
WBC: 9.3 10*3/uL (ref 4.0–10.5)
WBC: 18 10*3/uL — ABNORMAL HIGH (ref 4.0–10.5)
WBC: 5.4 10*3/uL (ref 4.0–10.5)

## 2011-01-04 LAB — PHOSPHORUS
Phosphorus: 3 mg/dL (ref 2.3–4.6)
Phosphorus: 3.2 mg/dL (ref 2.3–4.6)
Phosphorus: 3.2 mg/dL (ref 2.3–4.6)
Phosphorus: 3.3 mg/dL (ref 2.3–4.6)
Phosphorus: 3.3 mg/dL (ref 2.3–4.6)
Phosphorus: 3.5 mg/dL (ref 2.3–4.6)
Phosphorus: 3.6 mg/dL (ref 2.3–4.6)
Phosphorus: 4.3 mg/dL (ref 2.3–4.6)
Phosphorus: 4.5 mg/dL (ref 2.3–4.6)
Phosphorus: 4.7 mg/dL — ABNORMAL HIGH (ref 2.3–4.6)

## 2011-01-04 LAB — PROTIME-INR
INR: 1.2 (ref 0.00–1.49)
INR: 1.3 (ref 0.00–1.49)
INR: 1.4 (ref 0.00–1.49)
INR: 1.4 (ref 0.00–1.49)
INR: 5.1 (ref 0.00–1.49)
INR: 7 (ref 0.00–1.49)
INR: 1.8 — ABNORMAL HIGH (ref 0.00–1.49)
INR: 1.9 — ABNORMAL HIGH (ref 0.00–1.49)
INR: 3.3 — ABNORMAL HIGH (ref 0.00–1.49)
Prothrombin Time: 15.1 seconds (ref 11.6–15.2)
Prothrombin Time: 16.6 seconds — ABNORMAL HIGH (ref 11.6–15.2)
Prothrombin Time: 17.7 seconds — ABNORMAL HIGH (ref 11.6–15.2)
Prothrombin Time: 17.8 seconds — ABNORMAL HIGH (ref 11.6–15.2)
Prothrombin Time: 37.2 seconds — ABNORMAL HIGH (ref 11.6–15.2)
Prothrombin Time: 49.2 seconds — ABNORMAL HIGH (ref 11.6–15.2)
Prothrombin Time: 51.8 seconds — ABNORMAL HIGH (ref 11.6–15.2)
Prothrombin Time: 22.2 seconds — ABNORMAL HIGH (ref 11.6–15.2)
Prothrombin Time: 22.6 seconds — ABNORMAL HIGH (ref 11.6–15.2)
Prothrombin Time: 24.7 seconds — ABNORMAL HIGH (ref 11.6–15.2)
Prothrombin Time: 36.6 seconds — ABNORMAL HIGH (ref 11.6–15.2)

## 2011-01-04 LAB — CLOSTRIDIUM DIFFICILE EIA
C difficile Toxins A+B, EIA: NEGATIVE
C difficile Toxins A+B, EIA: NEGATIVE

## 2011-01-04 LAB — URINALYSIS, ROUTINE W REFLEX MICROSCOPIC
Glucose, UA: NEGATIVE mg/dL
Glucose, UA: NEGATIVE mg/dL
Specific Gravity, Urine: 1.026 (ref 1.005–1.030)
Specific Gravity, Urine: 1.026 (ref 1.005–1.030)
Urobilinogen, UA: 1 mg/dL (ref 0.0–1.0)

## 2011-01-04 LAB — STOOL CULTURE

## 2011-01-04 LAB — URINE MICROSCOPIC-ADD ON

## 2011-01-04 LAB — URINE CULTURE
Colony Count: >=100000
Colony Count: 30000
Special Requests: NEGATIVE

## 2011-01-04 LAB — OVA AND PARASITE EXAMINATION: Ova and parasites: NONE SEEN

## 2011-01-04 LAB — MAGNESIUM
Magnesium: 1.2 mg/dL — ABNORMAL LOW (ref 1.5–2.5)
Magnesium: 1.4 mg/dL — ABNORMAL LOW (ref 1.5–2.5)
Magnesium: 1.4 mg/dL — ABNORMAL LOW (ref 1.5–2.5)
Magnesium: 1.5 mg/dL (ref 1.5–2.5)
Magnesium: 1.7 mg/dL (ref 1.5–2.5)
Magnesium: 1.8 mg/dL (ref 1.5–2.5)
Magnesium: 1.9 mg/dL (ref 1.5–2.5)

## 2011-01-04 LAB — CULTURE, BLOOD (ROUTINE X 2)

## 2011-01-04 LAB — HEMOCCULT GUIAC POC 1CARD (OFFICE): Fecal Occult Bld: NEGATIVE

## 2011-01-05 LAB — CBC
HCT: 32.3 % — ABNORMAL LOW (ref 39.0–52.0)
HCT: 27.3 % — ABNORMAL LOW (ref 39.0–52.0)
HCT: 30.6 % — ABNORMAL LOW (ref 39.0–52.0)
HCT: 34.6 % — ABNORMAL LOW (ref 39.0–52.0)
Hemoglobin: 10.4 g/dL — ABNORMAL LOW (ref 13.0–17.0)
Hemoglobin: 11.9 g/dL — ABNORMAL LOW (ref 13.0–17.0)
MCHC: 34.1 g/dL (ref 30.0–36.0)
MCHC: 34.3 g/dL (ref 30.0–36.0)
MCHC: 34.3 g/dL (ref 30.0–36.0)
MCHC: 34.4 g/dL (ref 30.0–36.0)
MCV: 100.5 fL — ABNORMAL HIGH (ref 78.0–100.0)
MCV: 97.2 fL (ref 78.0–100.0)
MCV: 98.4 fL (ref 78.0–100.0)
MCV: 99 fL (ref 78.0–100.0)
MCV: 99.8 fL (ref 78.0–100.0)
Platelets: 193 10*3/uL (ref 150–400)
Platelets: 141 10*3/uL — ABNORMAL LOW (ref 150–400)
RBC: 3.15 MIL/uL — ABNORMAL LOW (ref 4.22–5.81)
RBC: 3.21 MIL/uL — ABNORMAL LOW (ref 4.22–5.81)
RBC: 3.49 MIL/uL — ABNORMAL LOW (ref 4.22–5.81)
RDW: 13.6 % (ref 11.5–15.5)
RDW: 13.6 % (ref 11.5–15.5)
RDW: 13.8 % (ref 11.5–15.5)
RDW: 13.8 % (ref 11.5–15.5)
WBC: 7.8 10*3/uL (ref 4.0–10.5)

## 2011-01-05 LAB — DIFFERENTIAL
Basophils Absolute: 0 10*3/uL (ref 0.0–0.1)
Basophils Absolute: 0 10*3/uL (ref 0.0–0.1)
Basophils Relative: 0 % (ref 0–1)
Basophils Relative: 0 % (ref 0–1)
Eosinophils Absolute: 0.1 10*3/uL (ref 0.0–0.7)
Eosinophils Absolute: 0.1 10*3/uL (ref 0.0–0.7)
Eosinophils Absolute: 0.2 10*3/uL (ref 0.0–0.7)
Eosinophils Relative: 2 % (ref 0–5)
Lymphs Abs: 1.2 10*3/uL (ref 0.7–4.0)
Monocytes Absolute: 0.9 10*3/uL (ref 0.1–1.0)
Monocytes Absolute: 0.5 10*3/uL (ref 0.1–1.0)
Monocytes Relative: 12 % (ref 3–12)
Monocytes Relative: 5 % (ref 3–12)
Neutro Abs: 7 10*3/uL (ref 1.7–7.7)
Neutro Abs: 7.5 10*3/uL (ref 1.7–7.7)
Neutrophils Relative %: 75 % (ref 43–77)
Neutrophils Relative %: 77 % (ref 43–77)

## 2011-01-05 LAB — PROTIME-INR
INR: 4.1 — ABNORMAL HIGH (ref 0.00–1.49)
Prothrombin Time: 29.4 seconds — ABNORMAL HIGH (ref 11.6–15.2)
Prothrombin Time: 43.7 seconds — ABNORMAL HIGH (ref 11.6–15.2)
Prothrombin Time: 16.2 seconds — ABNORMAL HIGH (ref 11.6–15.2)
Prothrombin Time: 18.9 seconds — ABNORMAL HIGH (ref 11.6–15.2)

## 2011-01-05 LAB — COMPREHENSIVE METABOLIC PANEL
ALT: 26 U/L (ref 0–53)
ALT: 17 U/L (ref 0–53)
AST: 42 U/L — ABNORMAL HIGH (ref 0–37)
Albumin: 2.4 g/dL — ABNORMAL LOW (ref 3.5–5.2)
Alkaline Phosphatase: 41 U/L (ref 39–117)
BUN: 9 mg/dL (ref 6–23)
BUN: 25 mg/dL — ABNORMAL HIGH (ref 6–23)
CO2: 26 mEq/L (ref 19–32)
Calcium: 8.8 mg/dL (ref 8.4–10.5)
Chloride: 102 mEq/L (ref 96–112)
Creatinine, Ser: 1.54 mg/dL — ABNORMAL HIGH (ref 0.4–1.5)
GFR calc Af Amer: >60 mL/min (ref >60)
GFR calc non Af Amer: 43 mL/min — ABNORMAL LOW (ref >60)
Glucose, Bld: 118 mg/dL — ABNORMAL HIGH (ref 70–99)
Potassium: 2.9 mEq/L — ABNORMAL LOW (ref 3.5–5.1)
Sodium: 140 mEq/L (ref 135–145)
Sodium: 135 mEq/L (ref 135–145)
Total Bilirubin: 1.5 mg/dL — ABNORMAL HIGH (ref 0.3–1.2)
Total Protein: 5.4 g/dL — ABNORMAL LOW (ref 6.0–8.3)

## 2011-01-05 LAB — BASIC METABOLIC PANEL
BUN: 17 mg/dL (ref 6–23)
CO2: 27 mEq/L (ref 19–32)
CO2: 28 mEq/L (ref 19–32)
CO2: 28 mEq/L (ref 19–32)
Calcium: 8.9 mg/dL (ref 8.4–10.5)
Calcium: 9 mg/dL (ref 8.4–10.5)
Chloride: 102 mEq/L (ref 96–112)
Chloride: 105 mEq/L (ref 96–112)
Chloride: 105 mEq/L (ref 96–112)
Chloride: 107 mEq/L (ref 96–112)
Creatinine, Ser: 0.88 mg/dL (ref 0.4–1.5)
Creatinine, Ser: 1.03 mg/dL (ref 0.4–1.5)
Creatinine, Ser: 2.09 mg/dL — ABNORMAL HIGH (ref 0.4–1.5)
GFR calc Af Amer: >60 mL/min (ref >60)
GFR calc Af Amer: >60 mL/min (ref >60)
Glucose, Bld: 115 mg/dL — ABNORMAL HIGH (ref 70–99)
Glucose, Bld: 115 mg/dL — ABNORMAL HIGH (ref 70–99)
Glucose, Bld: 120 mg/dL — ABNORMAL HIGH (ref 70–99)
Glucose, Bld: 136 mg/dL — ABNORMAL HIGH (ref 70–99)
Potassium: 3.8 mEq/L (ref 3.5–5.1)
Potassium: 4 mEq/L (ref 3.5–5.1)
Sodium: 141 mEq/L (ref 135–145)

## 2011-01-05 LAB — CROSSMATCH

## 2011-01-05 LAB — APTT: aPTT: 34 seconds (ref 24–37)

## 2011-02-05 ENCOUNTER — Encounter: Payer: Self-pay | Admitting: Cardiology

## 2011-02-08 NOTE — Progress Notes (Signed)
This encounter was created in error - please disregard.

## 2011-02-09 NOTE — Discharge Summary (Signed)
Steven Yang, Steven Yang NO.:  [id number]   MEDICAL RECORD NO.:  [id number]          PATIENT TYPE:  IPS   LOCATION:  4001                         FACILITY:  [facility]   PHYSICIAN:  Steven Yang, M.D.DATE OF BIRTH:  [birth year]   DATE OF ADMISSION:  02/21/2009  DATE OF DISCHARGE:  02/28/2009                               DISCHARGE SUMMARY   DISCHARGE DIAGNOSES:  1. Left intertrochanteric hip fracture with intramedullary nailing on      Feb 18, 2009, pain management.  2. Acute renal insufficiency.  3. Coumadin for deep vein thrombosis prophylaxis.  4. Hypertension.  5. History of coronary artery disease with coronary artery bypass      grafting.  6. Yang traumatic stress disorder.  7. History of early dementia.  8. Hyperlipidemia.  9. Benign prostatic hypertrophy.   This 77 year old white male with history of right total hip replacement  admitted on Feb 17, 2009, after fall at the grocery store with increased  left hip pain.  X-ray showed a left intertrochanteric hip fracture.  Underwent intramedullary nailing on Feb 18, 2009, per Dr. Dion Yang.  Coumadin added for deep vein thrombosis prophylaxis.  Weightbearing as  tolerated.  Postoperative pain management.  Mild acute renal  insufficiency with creatinine 2.09 responded to intravenous fluids with  creatinine 1.54.  Lisinopril had been discontinued secondary to mild  orthostatic changes and acute renal insufficiency.  The patient was  admitted for comprehensive rehab program.   PAST MEDICAL HISTORY:  See discharge diagnoses.  Remote smoker.  Occasional alcohol.   ALLERGIES:  None.   SOCIAL HISTORY:  He lives with his wife.  He is retired.  Wife with  recent arm fracture with limited lifting at the local family works, one  level home, one step to entry.   FUNCTIONAL HISTORY:  He was independent prior to admission.  Functional  status upon admission to rehab services was moderate assist bed  mobility, minimal  assist transfers, ambulating 25 feet with a rolling  walker, simple set up for upper body activities of daily living, minimum  to moderate assist lower body.   MEDICATIONS PRIOR TO ADMISSION:  1. Atenolol 25 mg daily.  2. Zocor 80 mg one-half tablet daily.  3. Lisinopril 20 mg daily.  4. Aspirin 325 mg daily.  5. Prozac 40 mg daily.  6. Niacin 1000 mg daily.  7. Razadyne 8 mg daily.   PHYSICAL EXAMINATION:  VITAL SIGNS:  Blood pressure 126/64, pulse 78,  temperature 98.3, and respirations 18.  GENERAL:  This was an alert male in no acute distress, names person,  place, date of birth.  Follows three-step commands.  MUSCULOSKELETAL:  Deep tendon reflexes were 2+.  Calves remain cool  without any swelling, erythema, nontender.  Sensation intact to light  touch.  Left hip incision clean and dry.  LUNGS:  Clear to auscultation.  CARDIAC:  Regular rate and rhythm.  ABDOMEN:  Soft and nontender.  Good bowel sounds.   REHABILITATION HOSPITAL COURSE:  The patient was admitted to inpatient  rehab services with therapies initiated on a 3-hour daily basis  consisting  of physical therapy, occupational therapy, and rehabilitation  nursing.  The following issues were addressed during the patient's  rehabilitation stay.  Pertaining to Mr. Steven Yang's left  intertrochanteric hip fracture, he had undergone intramedullary nailing  on Feb 18, 2009.  His weightbearing is as tolerated.  Neurovascular  sensation intact.  He would follow up with Dr. Dion Yang.  Pain management  ongoing with the use of Vicodin and Robaxin with good results.  Noted  postoperative mild confusion with noted history of mild dementia.  He  remained on his Razadyne as well as the addition of Zyprexa initially  with a waist belt in place for his safety.  These were later  discontinued.  His mental status had returned to baseline.  Blood  pressures monitored with atenolol with no orthostatic changes.  Lisinopril had recently been  discontinued due to some mild renal  insufficiency, orthostatic changes which since resolved.  His creatinine  function now responded nicely to 0.9.  He did have some mild  hypokalemia, which again resolved with potassium supplement.  He  remained on Zocor for history of hyperlipidemia.  Prozac ongoing for  history of Yang-traumatic stress disorder.  He was on Coumadin for deep  vein thrombosis prophylaxis after latest hip surgery.  He would resume  his aspirin therapy after Coumadin completed.  His latest INR was 2.6.  He will complete Coumadin therapy on March 21, 2009.  The patient  received weekly collaborative interdisciplinary team conferences to  discuss estimated length of stay.  Family teaching and any barriers to  discharge.  He was overall minimal assist for activities of daily  living, minimal assist bed mobility.  He was continent of bowel and  bladder.  He was discharged to home.   Latest labs showed hemoglobin 11.2, hematocrit 32.3, and platelet 193.  Sodium of 140, potassium 3.5, BUN 10, and creatinine 0.9.   DISCHARGE MEDICATIONS:  Time of dictation included:  1. Coumadin with latest dose of 1.5 mg to be completed on March 21, 2009.  2. Aspirin 81 mg daily.  3. Tenormin 12.5 mg daily.  4. Os-Cal 500 mg twice daily.  5. Niacin 1000 mg at bedtime.  6. Prozac 40 mg daily.  7. Razadyne 4 mg twice daily.  8. Zocor 40 mg daily.  9. Zyprexa 2.5 mg every evening.  10.Potassium chloride 20 mEq daily.  11.Protonix 40 mg daily.  12.Robaxin 500 mg every 6 hours as needed spasms.  13.Vicodin 5/325 one to two tablets every 4 hours as needed for pain.   DIET:  Regular.   SPECIAL INSTRUCTIONS:  Weightbearing as tolerated left lower extremity.  Home health nurse for Coumadin therapy to be completed on March 21, 2009.  Follow up with Dr. Dion Yang, Orthopedic Services.  Follow up Dr. Waynard Yang,  medical management.      Steven Yang, P.A.      Steven Yang, M.D.   Electronically Signed    DA/MEDQ  D:  02/27/2009  T:  02/27/2009  Job:  [id number]   cc:   Loraine Leriche A. Steven Yang, M.D.  Steven Post, MD  [facility]
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
## 2011-02-09 NOTE — Consult Note (Signed)
Steven Yang, Steven Yang NO.:  [id number]   MEDICAL RECORD NO.:  [id number]          PATIENT TYPE:  INP   LOCATION:  5530                         FACILITY:  [facility]   PHYSICIAN:  Steven Yang, M.D.    DATE OF BIRTH:  22-Feb-1922   DATE OF CONSULTATION:  04/21/2009  DATE OF DISCHARGE:                                 CONSULTATION   TIME OF CONSULTATION:  1500 p.m.   REQUESTING PHYSICIAN:  Steven A. Perini, MD   REASON FOR CONSULTATION:  Right gluteal abscess.   HISTORY OF PRESENT ILLNESS:  Steven Yang is an 77 year old white male  with a history of hypertension, recent malaria, coronary artery disease,  benign prostatic hypertrophy, diverticulosis and hemorrhoids, mild  Alzheimer's with PCSD.  He was admitted to the hospital approximately 1  month ago for C. diff colitis.  The patient improved from this and was  sent home.  The patient himself states that he has had some buttock pain  since that time; however, the wife states that he does have some  forgetfulness, and so she is unsure of when this actually started.  The  wife states that the patient told her about this abscess on Saturday.  At that time, he showed it to her and she began putting warm compresses.  She states that this did not help at all.  The patient then began having  repeat episodes of diarrhea, and they went back to see Dr. Neva Yang today,  where it is felt that the patient has had a repeat flare up in his C.  diff colitis.  He also noted the patient's right gluteal abscess and for  these reasons was admitted.  Upon admission, we were consulted for  evaluation of the right gluteal abscess.   REVIEW OF SYSTEMS:  Please see HPI, otherwise all other systems are  negative.   PAST MEDICAL HISTORY:  1. Recent acute renal failure which has resolved.  2. Recent left hip fracture for which he is doing well.  3. Hypertension.  4. Chronic kidney disease, stage I to II.  5. Atherosclerotic coronary artery  disease.  6. Anemia.  7. Mild Alzheimer disease.  8. Known micro hematuria.   PAST SURGICAL HISTORY:  1. Left trochanteric femoral nailing.  2. Right total hip replacement.  3. CABG x6.  4. Status post lap chole.   SOCIAL HISTORY:  The patient is married.  They have 3 sons and 6  grandchildren and 7 great-grandchildren.   ALLERGIES:  ARICEPT HAS BEEN ASSOCIATED WITH VIVID DREAMS.   MEDICATIONS:  1. Atenolol 25 mg p.o. daily.  2. Simvastatin 40 mg daily.  3. Lisinopril 20 mg daily.  4. Aspirin 325 mg daily.  5. Fluoxetine 20 mg 2 every morning.  6. Galantamine ER 60 mg daily.  7. Niacin 1000 mg daily.  8. Omega-3 fish oil 4 g daily.   PHYSICAL EXAMINATION:  GENERAL:  [name] is a very pleasant, 10-  year-old white male, who is currently lying in bed in no acute distress.  VITAL SIGNS:  Temperature 98, pulse 76, respirations 18, blood  pressure  110/58.  HEENT:  Head is normocephalic, atraumatic.  Sclerae non-injected.  Pupils are equal, round, and reactive to light.  Ears and nose without  any obvious masses or lesions.  No rhinorrhea.  Mouth is pink and moist.  Throat shows some exudate.  HEART:  Regular rate and rhythm.  Normal S1 and S2.  No murmurs,  gallops, or rubs noted.  +2 carotid, radial, and pedal pulses  bilaterally.  LUNGS:  Clear to auscultation bilaterally with no wheezes, rhonchi, or  rales noted.  Respiratory effort is nonlabored.  ABDOMEN:  Soft, nontender, nondistended with active bowel sounds.  MUSCULOSKELETAL:  All 4 extremities are symmetrical.  No cyanosis,  clubbing, or edema.  He does have various scratches on his right lower  extremity from poison ivy.  SKIN:  The patient has a right gluteal abscess that is approximately 3 x  3 cm, that is erythematous and indurated.  There are 2 small openings in  the center of the abscess that are spontaneously draining purulent and  sanguineous fluid.  This is tender to palpation.  NEURO:  Cranial  nerves  II to XII appear to be grossly intact.  PSYCH:  The patient is alert and oriented x3 with an appropriate affect.   LABS AND DIAGNOSTIC:  There are no diagnostics.  The patient had labs  done at Dr. Laurey Yang office on July 6, showing a white blood cell count  of 8200, hemoglobin of 11.9, hematocrit of 35.7, platelet count of  344,000.  BUN 15, creatinine 0.9.  Sodium 140, potassium 5.0.  No new  labs have been drawn yet, and there are no diagnostic.   IMPRESSION:  1. Recurrent Clostridium difficile colitis.  2. Right gluteal abscess.  3. Hypertension.  4. Recent malaria.  5. Coronary artery disease.  6. Benign prostatic hypertrophy.  7. Mild Alzheimer.  8. Post-traumatic stress disorder.  9. Diverticulosis.   PLAN:  At this time, we agree with p.o. Vancomycin as this should treat  the patient's Clostridium difficile as well as hopefully help the  patient's abscess.  After evaluation, it appears the patient would  probably benefit from a bedside incision and drainage to further drain  this abscess to permanent healing.  At that time, we will probably  obtain cultures from more narrowed antibiotic coverage.       Steven Cape, PA      Steven Yang, M.D.  Electronically Signed    KEO/MEDQ  D:  04/21/2009  T:  04/22/2009  Job:  [id number]   cc:   Steven Yang, M.D.

## 2011-02-09 NOTE — H&P (Signed)
NAMEJAQUON, GINGERICH NO.:  [id number]   MEDICAL RECORD NO.:  [id number]          PATIENT TYPE:  INP   LOCATION:  1438                         FACILITY:  [facility]   PHYSICIAN:  Eulas Post, MD    DATE OF BIRTH:  [birth year]   DATE OF ADMISSION:  02/17/2009  DATE OF DISCHARGE:                              HISTORY & PHYSICAL   CHIEF COMPLAINT:  Left hip pain.   HISTORY:  Mr. Steven Yang is an 77 year old gentleman who was at the  grocery store today when he tripped over the curb and fell onto his left  side.  He had acute onset of left-sided hip pain.  He rates the pain as  moderate to severe.  He was unable to walk.  Bearing weight makes it  worse.  Laying still makes it better.  Pain medications also make it  better.  He denies any other associated injuries.  He did not lose any  consciousness.  The pain is located around the left groin.   PAST MEDICAL HISTORY:  Right total hip arthroplasty performed by Dr.  Eulah Pont, and he also had a coronary artery bypass graft performed in  2002.   FAMILY HISTORY:  His father died at age 35 of heart disease.   SOCIAL HISTORY:  He lives with his wife, who unfortunately just recently  broke her wrist.  He quit smoking 30 years ago.   REVIEW OF SYSTEMS:  A 14-point review of systems was performed and was  negative with the exception of those indicated in the musculoskeletal  exam.  He does not have any symptoms of angina.   PHYSICAL EXAM:  CONSTITUTIONAL:  He is alert and oriented x3 and in no  acute distress and well-developed, well-nourished.  EYES:  Extraocular movements are intact.  LYMPHATIC:  He has no cervical or axillary lymphadenopathy.  CARDIOVASCULAR:  He has a well-healed surgical scar from previous CABG  surgery on his chest as well as his left leg.  He has no significant  pedal edema.  He has a regular rate and rhythm.  RESPIRATORY:  He has no cyanosis and no increase use of respiratory  muscles.  PSYCHIATRIC:  His judgment and insight are intact and he is appropriate  through the course of our interaction, and he understands the  implications of this injury and our discussion.  SKIN:  He has no skin breaks over the left hip.  He does have some  abrasions over the left elbow.  NEUROLOGIC:  His sensation is intact throughout his left lower  extremity.  EHL and FHL are firing.  MUSCULOSKELETAL:  He has a positive log-roll on the left side and cannot  do a straight leg raise.  The right side is atraumatic.  His left elbow  is nontender although there are some abrasions.   X-RAYS:  X-rays of his pelvis demonstrate an intertrochanteric hip  fracture on the left side.   IMPRESSION:  Left intertrochanteric hip fracture with a history of  coronary disease.   PLAN:  Admit and preoperative medical optimization in preparation for  left hip trochanteric  femoral nailing.  The risks, benefits and  alternatives were discussed with him and his family at length.  We will  plan to provide him with p.o. and IV analgesia as needed as well as  sequential compression devices and provide with preoperative antibiotics  for antimicrobial prophylaxis and postoperative Coumadin for DVT  prophylaxis.      Eulas Post, MD  Electronically Signed     JPL/MEDQ  D:  02/17/2009  T:  02/17/2009  Job:  [id number]
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
## 2011-02-09 NOTE — Discharge Summary (Signed)
Steven Yang, Steven Yang NO.:  [id number]   MEDICAL RECORD NO.:  [id number]          PATIENT TYPE:  IPS   LOCATION:  4001                         FACILITY:  [facility]   PHYSICIAN:  Ranelle Oyster, M.D.DATE OF BIRTH:  [birth year]   DATE OF ADMISSION:  02/21/2009  DATE OF DISCHARGE:  03/03/2009                               DISCHARGE SUMMARY   ADDENDUM:  The patient initially scheduled for discharge on February 28, 2009.  However, on the night prior to discharge, he spiked a  temperature.  Chest x-ray completed, showed no active disease.  Blood  cultures were obtained, later showed no growth.  Urinalysis study showed  positive nitrate as well as white blood cells being too numerous to  count.  Initially, he received one dose of Cipro changed to intravenous  Rocephin 1 g every 24 hours.  Noted fever of 104.  Intravenous  antibiotics had been initiated, temperature has resolved to 98, oxygen  saturation remained greater than 94% on room air.  Urinalysis study  results returned E. coli.  He finished his intravenous Rocephin on March 03, 2009, placed on amoxicillin 7-day course for discharge home.  He  remained afebrile over the past 24 hours.  All issues were discussed  with family.  Followup chemistries initially after I increased white  blood cell count to 18,000, improved to 5.4.  He would remain on  Coumadin therapy with INR of 2.1 on March 03, 2009, a home health nurse  with complete Coumadin protocol.  His lungs were clear to auscultation.  He would complete amoxicillin as advised.  No other changes were made in  regards to his medications.  He was discharged to home.  Please see  discharge summary for full results in detail.      Mariam Dollar, P.A.      Ranelle Oyster, M.D.  Electronically Signed    DA/MEDQ  D:  03/03/2009  T:  03/03/2009  Job:  [id number]
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
## 2011-02-09 NOTE — H&P (Signed)
NAMEJAYDAN, MEIDINGER NO.:  [id number]   MEDICAL RECORD NO.:  [id number]          PATIENT TYPE:  EMS   LOCATION:  MAJO                         FACILITY:  [facility]   PHYSICIAN:  Larina Earthly, M.D.        DATE OF BIRTH:  Jun 19, 1922   DATE OF ADMISSION:  04/09/2008  DATE OF DISCHARGE:                              HISTORY & PHYSICAL   CHIEF COMPLAINT:  Nausea and vomiting that is refractory associated with  fevers and left leg cellulitis.   HISTORY OF PRESENT ILLNESS:  This is an 77 year old Caucasian male who  has a past medical history significant for coronary artery disease,  diverticulosis with bleed, hypertension, PTSD, heart murmur, hip  replacement, Alzheimer's dementia, and microcytic hematuria who was seen  in our office on April 08, 2008, after waking up that same day with  fevers, chills, nausea, and vomiting associated with bilateral shoulder  pain, back pain, and mild congestion.  His fever resolved after taking  Tylenol with a reduction in his musculoskeletal discomfort; however, his  nausea and vomiting did persist to some extent albeit to a lower degree.  He was seen in our office later that day.  At the time, he was  hemodynamically stable with an oxygen saturation of 97% on room air.  His blood pressure was 110/70 and pulse 84.  He was afebrile and his  exam was benign for any evidence of cardiovascular or pulmonary  compromise.  He had no abdominal discomfort, no tenderness, and he had  no erythema in the lower extremities.  Chest x-ray was benign and it was  thought that his clinical presentation was consistent with a viral  syndrome and after extensive discussion with the patient and wife, they  were agreeable for discharge home on Zofran, Ultram for pain, and a  clear fluid diet with close followup planned.  Hospitalization was  offered at that time; however, given the fact that the patient is  clinically improving, it was deferred.   LABORATORY  EVALUATION:  Came back later that day and it was significant  for leukocytosis with a white blood cell count of 27,000 with normal  BMET, LFTs, hemoglobin, and hematocrit.  Given the patient's elderly age  and multiple comorbidities, he was prescribed empiric Levaquin to his  local pharmacy after talking to the patient's wife at that time, the  patient's wife reported that he was drinking clear fluids and appeared  to be slowly resolving.  This morning, the patient again developed an  increasing nausea, vomiting, fevers, and chills; unable to take p.o.  despite using Zofran now with left lower extremity erythema and swelling  consistent with a cellulitis.  Our plan now will be to admit the patient  and obtain an ultrasound of the left lower extremity, obtain a KUB to  rule out obstruction, obtain blood cultures and routine labs as well as  treat the patient empirically with IV Ancef for presumed cellulitis, and  provide supportive care.   REVIEW OF SYSTEMS:  Again, positive for fevers, chills, nausea,  vomiting, but negative for shortness of breath, chest  pain, abdominal  pain, change in bowel habits, negative for decreased urine output,  negative for focal neurological deficits, but positive for some  bilateral shoulder pain that is now resolved, negative for gross  hematuria, negative for cough, and negative for headaches.   PROBLEM LIST:  1. Coronary artery disease, status post coronary artery bypass      grafting in March 2002.  2. History of bilateral lower extremity cellulitis on the right side      in August 2004 and left side in January 2005.  3. History of diverticulosis and hemorrhage from the colon in June      2004.  4. Hypertension.  5. History of cholecystectomy.  6. Posttraumatic stress disorder.  7. Heart murmur in need of SBE prophylaxis.  8. History of chronic gait disorder since injury in World War II.  9. History of right total hip replacement in 2004.   10.History of benign prostatic hypertrophy with a history of urinary      retention.  11.History of Alzheimer's dementia, mild, followed at the [facility].  12.Hyperlipidemia.  13.History of microcytic hematuria, evaluated by Urology.   SOCIAL HISTORY:  The patient is married for greater than 50 years.  Has  three children, six grandchildren, and seven great-grandchildren.  He is  a retired Materials engineer.  Quit smoking  tobacco in 1980 after 40 pack years and has occasional alcohol use.   FAMILY HISTORY:  Significant for early heart disease and Parkinson's  disease.   CURRENT MEDICATIONS:  1. Enteric-coated aspirin 325 mg p.o. daily.  2. Lisinopril half of 40 mg p.o. daily.  3. Atenolol 25 mg p.o. daily.  4. Prozac 40 mg p.o. daily.  5. Zocor half of an 80 mg tablet p.o. daily.  6. Fish oil and niacin each day.   LABORATORY DATA:  Labs from April 08, 2008, reveal a urinalysis positive  for microscopic blood.  Sodium 143, potassium 5.1, BUN 17, creatinine  1.3, glucose 165, calcium 10.3.  Liver function tests normal.  White  blood cell count 27,000, hemoglobin 14.7, hematocrit 45.3%, platelet  count 265.   PHYSICAL EXAMINATION:  GENERAL:  We have a frail pale Caucasian male  sitting in a wheelchair with nausea and vomiting.  VITAL SIGNS:  Blood pressure 130/60, pulse 72 and regular, temperature  98.1 degrees Fahrenheit, oxygen saturation 94% on room air.  HEENT:  Sclerae anicteric.  Extraocular movements are intact.  Face is  symmetrical.  Tympanic membranes are clear bilaterally.  NECK:  Supple.  There is no cervical lymphadenopathy.  LUNGS:  Clear to auscultation bilaterally.  CARDIOVASCULAR:  Regular rate and rhythm without murmurs, rubs, or  gallops.  ABDOMEN:  Soft, nontender, and nondistended abdomen.  Bowel sounds are  present.  No guarding is present at this time.  There is no edema on the  right lower extremity, but there is 1+  edema in the left lower extremity  with erythema ascending to just below the knee.  NEUROLOGIC:  Grossly nonfocal.   ASSESSMENT/PLAN:  1. Fever with leukocytosis, possibly related to viral syndrome and/or      right leg cellulitis complicated by nausea and vomiting.  Our plan      at this time will be to obtain blood cultures x2 knowing that the      patient has already had 2 doses of empiric Levaquin.  Check a      urinalysis with possible  culture and provide Ancef for cellulitis      and check a chest x-ray.  2. Nausea and vomiting, refractory.  We will provide IV fluids      watching for volume overload.  We will check electrolytes and      provide antiemetics.  We will check a KUB to rule out obstruction.  3. Coronary artery disease, currently appears hemodynamically stable.      We will continue aspirin, beta blockade, and ACE inhibitor as blood      pressure remits.  4. We will provide deep vein thrombosis prophylaxis.  5. The patient is currently a full code.  6. Right lower leg cellulitis.  Again, we will provide Ancef and      possibly adjust antibiotic regimen if no improvement occurs.      Larina Earthly, M.D.  Electronically Signed     RA/MEDQ  D:  04/09/2008  T:  04/10/2008  Job:  [id number]   cc:   [facility]  Peter M. [place], M.D.

## 2011-02-09 NOTE — H&P (Signed)
NAMEBEDFORD, Steven Yang NO.:  [id number]   MEDICAL RECORD NO.:  [id number]          PATIENT TYPE:  IPS   LOCATION:  4005                         FACILITY:  [facility]   PHYSICIAN:  Ranelle Oyster, M.D.DATE OF BIRTH:  1921/10/03   DATE OF ADMISSION:  02/21/2009  DATE OF DISCHARGE:                              HISTORY & PHYSICAL   PRIMARY CARE PHYSICIAN:  Mark A. Waynard Edwards, MD   ORTHOPEDIST:  Eulas Post, MD   CHIEF COMPLAINT:  Left hip pain.   HISTORY OF PRESENT ILLNESS:  An 77 year old white male with a history of  right total hip replacement in 2004 and history of CAD with CABG in  2002.  The patient was admitted on Feb 17, 2009, after a fall at a  grocery store with left hip pain.  X-ray showed left intertrochanteric  hip fracture.  The patient underwent IM nailing on Feb 18, 2009, by Dr.  Dion Saucier.  He was placed on Coumadin for DVT prophylaxis and is  weightbearing as tolerated.  Postoperative pain control was achieved  with Vicodin.  The patient denies any chest pain or shortness of breath.  He developed some mild renal insufficiency responding to IV fluids.  His  ACE inhibitors was discontinued as well due to low blood pressure and  the renal insufficiency.  Rehab evaluated the patient on Feb 20, 2009,  and felt that he could benefit from admission and ultimately was brought  to the Rehab Unit today.   REVIEW OF SYSTEMS:  Notable for some urinary retention, mild dementia,  and depression.  He reports some swelling and pain in the left hip.  Other pertinent positives are above and full reviews in the written H  and P.   PAST MEDICAL HISTORY:  Positive for hypertension, PTSD, mild dementia,  depression, decreased hearing, BPH, diverticulosis, hyperlipidemia, CAD  with CABG in 2002, left lower extremity cellulitis, cholecystectomy,  right total hip replacement in 2004, remote tobacco and occasional  alcohol.   FAMILY HISTORY:  Positive for CAD.   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  Wife with recent arm fracture and can only  provide limited assistance.  Local family works.  He has a 1-level house  with 1 step to enter.   ALLERGIES:  None.   HOME MEDICATIONS:  Atenolol, Zocor, lisinopril, aspirin, Prozac, niacin,  and Razadyne 8 mg daily.   LABORATORY DATA:  Hemoglobin 10.4, white count 9.4, and platelets 126.  Sodium 135, potassium 3.5, BUN 25, and creatinine 1.5.  INR 1.9.   PHYSICAL EXAMINATION:  VITAL SIGNS:  Blood pressure 126/64, pulse 78,  respiratory rate 18, and temperature 98.3.  GENERAL:  The patient is generally pleasant, alert and oriented x3.  EYES:  Pupils are equal, round, and reactive to light.  EARS, NOSE, AND THROAT:  Notable for a pink moist mucosa with full set  of dentures.  NECK:  Supple without JVD or lymphadenopathy.  CHEST:  Clear to auscultation bilaterally without wheezes, rales, or  rhonchi.  HEART:  Regular rate  and rhythm without murmurs, rubs, or gallops.  ABDOMEN:  Soft and nontender.  Bowel sounds are positive.  SKIN:  Notable for some bruising around the left hip wound.  Left hip  was with serosanguineous drainage at the operative site.  Wound is well  approximated with Steri-Strips and covered with Mepilex dressing.  Left  lower extremity in general had trace to 1+ edema throughout.  NEUROLOGIC:  Cranial nerves II-XII are grossly intact.  Reflexes are 1+.  Sensation is intact.  Judgment, orientation, memory, and mood were all  age appropriate.  Motor functions near 5/5 upper extremity.  Right lower  extremity 3/5-4/5 distally.  He is 1/5 proximal left lower extremity to  3+ to 4/5 distally.   POSTADMISSION PHYSICIAN EVALUATION:  1. Functional deficits secondary to left intertrochanteric hip      fracture, status post IM nailing on postoperative day #3.  2. The patient is admitted to receive collaborative interdisciplinary      care  between the physiatrist, rehab nursing staff, and therapy      team.  3. The patient's level of medical complexity and substantial therapy      needs in context of that medical necessity cannot be provided at a      lesser intensity of care.  4. The patient has experienced substantial functional loss from his      baseline.  Upon functional assessment at the time of preadmission      screening, the patient was mod assist bed mobility, min assist      transfer, min assist ambulating 25 feet with cues, mod assist with      ADLs.  That evaluation within the last 24 hours has not changed      substantially before admission today.  Judging by the patient's      diagnosis, physical exam, and functional history is the potential      for functional progress which will result in measurable gains while      inpatient rehab.  These gains will be of substantial and practical      use upon discharge to home in facilitating mobility and self-care.  5. Physiatrist will provide 24-hour management of medical needs as      well as oversight of the therapy plans/treatment and provide      guidance as appropriate regarding interaction of the two.  Medical      problem list and plan are listed below.  6. The 24-Hour Rehab Nursing will assist in management of the      patient's skin care needs as well as nutrition, medication      administration, and integration of therapy concepts and techniques.  7. PT will assess and treat for lower extremity strength, adaptive      equipment, range of motion, functional mobility and gait with goals      modified independent to supervision.  8. OT will assess and treat for upper extremity use and ADLs as well      as adaptive equipment, techniques, and safety with goals      supervision to modified independent.  9. Case Management and Social Worker will assess and treat for      psychosocial issues and discharge planning.  10.Team conferences will be held weekly to assess  progress towards      goals and to determine barriers to discharge.  11.The patient has demonstrated sufficient medical stability and      exercise capacity to tolerate at least 3 hours  of therapy per day      at least 5 days per week.  12.Estimated length of stay is 7+ days.  Prognosis is good.   MEDICAL PROBLEM LIST AND PLAN:  1. Acute renal insufficiency:  We will follow electrolytes serially on      rehab.  BUN and creatinine are normalizing.  We will push fluids      and avoid exacerbating medications, etc.  2. DVT prophylaxis with Coumadin.  INR and Coumadin management per      Pharmacy team.  We will watch closely for signs and symptoms of      bleeding complications.  3. Blood pressure control:  Atenolol.  The patient's pressure is      intensive today.  4. History of CAD:  Continue aspirin and above medications.  5. Early dementia:  Razadyne.  The patient seems fairly compensated at      this point.  6. BPH:  We will follow voiding patterns and address as appropriate.      Check PVRs as needed.      Ranelle Oyster, M.D.  Electronically Signed     ZTS/MEDQ  D:  02/21/2009  T:  02/22/2009  Job:  [id number]   cc:   Loraine Leriche A. Perini, M.D.  Eulas Post, MD

## 2011-02-09 NOTE — Discharge Summary (Signed)
NAMEGABREIL, Steven Yang NO.:  [id number]   MEDICAL RECORD NO.:  [id number]          PATIENT TYPE:  INP   LOCATION:  1438                         FACILITY:  [facility]   PHYSICIAN:  Eulas Post, MD    DATE OF BIRTH:  March 29, 1922   DATE OF ADMISSION:  02/17/2009  DATE OF DISCHARGE:  02/21/2009                               DISCHARGE SUMMARY   ADMISSION DIAGNOSIS:  Left intertrochanteric hip fracture.   DISCHARGE DIAGNOSES:  1. Left intertrochanteric hip fracture.  2. Acute blood loss anemia.  3. History of CABG.  4. Hx of right total hip arthroplasty.  5. Benign prostatic hypertrophy.  6. Early Alzheimer's dementia.  7. Acute renal insufficiency.   DISCHARGE MEDICATIONS:  Include Coumadin as well as a baby aspirin for a  period of 1 month.  He can go back to his full dose aspirin after he is  off his Coumadin.  He is also on Vicodin for pain.  He will resume his  home medications as well. Discontinued Lisinopril.   DISCHARGE INSTRUCTIONS:  He can be weightbearing as tolerated on the  left lower extremity.  He does not have any hip precautions except for  total hip precautions on the right side.   HOSPITAL COURSE:  Mr. Steven Yang is an 77 year old gentleman who  fell and had a left hip fracture.  He underwent trochanteric femoral  nailing on Feb 18, 2009.  He tolerated the procedure well and did not  have any complications.  He was working with physical therapy and making  slow progress.  He did have an episode of acute renal insufficiency  during this hospitalization, which was improving at the time of transfer  with IV fluids. His dressings were changed on postoperative day 3 and  his wounds were clean, dry, and intact. He was Coumadin for DVT  prophylaxis.  He had a transfusion of 2 units of packed red blood cells  for acute postoperative blood loss anemia in the setting of a history of  cardiac disease.  He was also given sequential compression devices  for  DVT prophylaxis.  He was given perioperative antibiotics for  antimicrobial prophylaxis.  His pain was controlled initially with IV  and subsequently p.o. analgesics.  He benefited maximally from this  hospital stay and will be planned to be discharged to inpatient rehab.   FOLLOWUP:  There are no complications and he will plan to follow up with  me in approximately 2 weeks.      Eulas Post, MD  Electronically Signed     JPL/MEDQ  D:  02/21/2009  T:  02/21/2009  Job:  [id number]

## 2011-02-09 NOTE — Op Note (Signed)
NAMEBARNEY, [name] NO.:  [id number]   MEDICAL RECORD NO.:  [id number]          PATIENT TYPE:  INP   LOCATION:  1438                         FACILITY:  [facility]   PHYSICIAN:  Eulas Post, MD    DATE OF BIRTH:  Sep 19, 1922   DATE OF PROCEDURE:  02/18/2009  DATE OF DISCHARGE:                               OPERATIVE REPORT   PREOPERATIVE DIAGNOSIS:  Left intertrochanteric hip fracture.   POSTOPERATIVE DIAGNOSIS:  Left intertrochanteric hip fracture.   OPERATIVE PROCEDURE:  Left trochanteric femoral nailing.   ANESTHESIA:  General.   ESTIMATED BLOOD LOSS:  Minimal.   OPERATIVE IMPLANTS:  A Synthes trochanteric femoral nail size 11 mm x  400 mm with a single helical blade size 100 mm.   PREOPERATIVE INDICATIONS:  Mr. Steven Yang is an 77 year old  gentleman who fell and had a left intertrochanteric hip fracture.  He  had a right total hip arthroplasty in the remote past.  He elected to  undergo left hip internal fixation.  The risks, benefits and  alternatives were discussed with the patient and the family  preoperatively, including, but not limited to the risks of infection,  bleeding, nerve injury, malunion, nonunion, hardware prominence,  hardware failure, the need for revision surgery, blood clots,  cardiopulmonary complications, among others and they were willing to  proceed.   OPERATIVE PROCEDURE:  The patient was brought to the operating room and  placed in supine position.  One gram of intravenous Ancef was given.  General anesthesia was administered.  The patient was placed in gentle  traction.  All bony prominences were padded.  Great care was taken  during the positioning of the right leg in order to protect the right  total hip stability.  After sterile prep and drape, confirmation of  reduction was made with the C-arm.  We then made an incision proximally  over the greater trochanter and introduced a guidewire.  Confirmation  was made of its  placement on AP and lateral views.  We then opened the  proximal femur and placed our nail.  We had measured the length  preoperatively.  The nail was placed by hand.  Once the nail had  satisfactory seating, we then placed our guidewire into the center,  center position of the femoral head through the jig.  We then measured  the length and placed our helical blade.  This provided anatomic  reduction and stable fixation.  Overall bone quality was mediocre.  Confirmation was made on multiple views and the instruments were removed  and the proximal locking screw was secured loosely over the sliding  helical blade.  We then removed all the jig apparatus and irrigated the  wounds copiously and closed with 3-0 Vicryl followed by 4-0 Monocryl for  the skin and Steri-Strips and sterile gauze.  The patient was awakened  and returned to the PACU in stable and satisfactory condition.  There no  complications.  The patient tolerated the procedure well.      Eulas Post, MD  Electronically Signed     JPL/MEDQ  D:  02/18/2009  T:  02/19/2009  Job:  [id number]

## 2011-02-09 NOTE — Op Note (Signed)
NAMEHENDRIK, Steven Yang NO.:  [id number]   MEDICAL RECORD NO.:  [id number]          PATIENT TYPE:  INP   LOCATION:  5530                         FACILITY:  [facility]   PHYSICIAN:  Cherylynn Ridges, M.D.    DATE OF BIRTH:  [birth year]   DATE OF PROCEDURE:  04/22/2009  DATE OF DISCHARGE:                               OPERATIVE REPORT   PROCEDURE:  Incision and drainage of right gluteal abscess.  The  procedure, benefits, and risks of the procedure were explained to the  patient.  He signed his consent form, and after this explanation wished  to proceed with the procedure.   INDICATIONS:  Mr. Yamashiro is an 77 year old white male who has had a  history of this right gluteal abscess for at least the past several  days.  Warm compresses were used; however, these did not help.  The  patient was admitted yesterday for Clostridium difficile and was found  to have this abscess.  There was some purulent drainage and felt needed  to be incised and drained.   SURGEON:  Marta Lamas. Lindie Spruce, MD   DESCRIPTION:  The patient was turned on his left side.  His right  gluteus was exposed.  Iodine was used to clean this area.  Lidocaine 2%  was then used to anesthetize this area.  An 11-blade scalpel was then  used to create an elliptical incision.  The core of abscess was removed,  and the cavity was explored with a hemostat.  There was 1 small cavity  noted at about 12 o'clock that had some purulent drainage.  This was  cultured and sent for culture and sensitivity.  The rest of the cavity  was then explored.  No further pockets were found.  Gauze was used to  hold pressure for hemostasis.  Once hemostasis was achieved, dry gauze  packing was placed in the wound, and a pressure dressing was placed over  the wound.   COMPLICATIONS:  None.   ESTIMATED BLOOD LOSS:  Minimal.   DISPOSITION:  The patient tolerated the procedure well.  After the  procedure, he is lying in bed comfortably, in no  acute distress.      Letha Cape, PA      Cherylynn Ridges, M.D.  Electronically Signed   KEO/MEDQ  D:  04/22/2009  T:  04/22/2009  Job:  [id number]   cc:   Loraine Leriche A. Perini, M.D.

## 2011-02-12 NOTE — Discharge Summary (Signed)
[place]  Patient:    Steven Yang, Steven Yang                     MRN: [id number] Adm. Date:  [id number] Disc. Date: [id number] Attending:  Daisey Must Dictator:   Chinita Pester, N.P. CC:         Rodrigo Ran, M.D.                           Discharge Summary  DISCHARGE DIAGNOSIS:  Leukocytosis with fevers and chills.  HISTORY OF PRESENT ILLNESS:  This is a 77 year old gentleman who recently had bypass surgery on March 14, with LIMA to the LAD, saphenous to the first diagonal and OM, posterolateral saphenous to the RCA and saphenous to second diagonal.  Surgeon was Dr. Laneta Simmers.  Postoperatively, he had some atrial fibrillation treated with amiodarone which was stopped prior to discharge. The patient did present with an MI and had semiurgent surgery.  LV-gram shows him to have an EF of 40%.  Since discharge, the patient had done fairly well until this weekend when he developed some fevers and chills.  Saturday he developed some cough and anorexia.  The morning of admission, he developed nausea, vomiting and admitted to some dysuria.  In the office, he is pale and vomiting.  His wife said last night he had several episodes of diaphoresis.  PAST MEDICAL HISTORY: 1. Gallbladder surgery which was remote. 2. Hypertension. 3. Bypass surgery.  ALLERGIES:  No known drug allergies.  HOSPITAL COURSE:  The patient was admitted and had a CT surgery consult.  He was started on antibiotics with Tequin IV 400 mg.  The patient did appear to be dehydrated with a BUN of 18 and a creatinine of 1.1.  The patients potassium level was 3.3 which had been replaced with 20 mEq of potassium.  He continued to improve.  Blood cultures showed no growth for two days.  Urine culture was positive for E. coli.  The patient was also noted to be in atrial flutter.  He was placed on digoxin, Cardizem and Toprol XL.  He converted to a normal sinus rhythm.  The patient continued to  improve, although he did complain of some "sticking of his food" when swallowing.  He was discharged to home.  DISCHARGE MEDICATIONS: 1. Toprol XL 50 mg daily. 2. Digoxin 0.125 mg daily. 3. Altace 2.5 mg daily. 4. Coated aspirin 325 mg daily. 5. Cardizem CD 120 daily. 6. Tequin 400 mg for eight more days, one p.o. daily.  ACTIVITY:  As tolerated.  DIET:  Low fat, low cholesterol and low salt diet.  He was instructed to take in six small meals per day.  If he continues to have difficulty with dysphagia, he was instructed to call the office and possibly get a GI consult as an outpatient.  He was to have digoxin level on Monday, January 02, 2001, and a follow-up visit with Dr. Ladona Ridgel was scheduled on April 23, at 3:30 p.m. DD:  12/29/00 TD:  12/30/00 Job: [id number]

## 2011-02-12 NOTE — Discharge Summary (Signed)
[place]  Patient:    Steven Yang, Steven Yang                     MRN: [id number] Adm. Date:  [id number] Disc. Date: 12/14/00 Attending:  Cleatrice Burke Dictator:   Adair Patter, P.A. CC:         Alleen Borne, M.D.   Discharge Summary  DATE OF BIRTH:  July 29, 1922  ADMISSION DIAGNOSIS:  Acute myocardial infarction.  SECONDARY DIAGNOSIS:  Postoperative atrial fibrillation.  DISCHARGE DIAGNOSIS:  Coronary artery disease.  PROCEDURES: 1. Cardiac catheterization. 2. Intra-aortic balloon pump insertion. 3. Bilateral carotid duplex. 4. Upper extremity Doppler exam. 5. Lower extremity Doppler exam. 6. Coronary artery bypass graft x 6.  HOSPITAL COURSE:  Steven Yang was admitted to [facility] on December 08, 2000, secondary to acute myocardial infarction.  He subsequently underwent cardiac catheterization on December 08, 2000.  Despite cardiac catheterization, the patient had ongoing chest pain.  Because of this, the patient underwent emergent coronary artery bypass graft x 6 with left internal mammary artery ______ to the left anterior descending artery, sequential saphenous vein graft to the first diagonal, the obtuse marginal, and the posterior lateral artery, saphenous vein graft to the right coronary artery, and a saphenous vein graft to the second diagonal artery.  Prior to undergoing coronary artery bypass graft the patient had insertion of intra-aortic balloon pump.  His coronary artery bypass graft was performed by Dr. Ladona Ridgel under general endotracheal anesthesia and cardiac pulmonary bypass.  No complications were noted during the procedure.  Postoperatively the patient continued to require support with intra-aortic balloon pump and low-dose dopamine.  These interventions, however, were weaned off postoperatively.  Postoperatively the patients hospital course was complicated by rapid atrial fibrillation.  The patient converted  back to sinus rhythm with IV amiodarone.  His amiodarone was switched to orally and was subsequently discontinued.  The patient remained in sinus rhythm throughout the remainder of his hospital stay.  The remainder of his hospital course was uneventful, and he was discharged home in stable and satisfactory condition on December 14, 2000.  DISCHARGE MEDICATIONS: 1. Tylox 1-2 every four to six hours as needed for pain. 2. Aspirin 325 mg 1 tablet daily. 3. Lopressor 50 mg 1/2 tablet every 12 hours. 4. Lasix 40 mg 1 tablet daily for three days. 5. Potassium chloride 20 mEq 1 tablet daily for three days. 6. Altace 2.5 mg 1 tablet daily. 7. Combivent inhaler 2 puffs four times daily.  ACTIVITY:  The patient was told to avoid driving, strenuous activity, lifting objects over 10 pounds.  DIET:  Low fat, low salt.  WOUND CARE:  The patient was told that he could shower and clean the incisions with soap and water.  DISPOSITION:  Home.  FOLLOW-UP:  The patient was told to follow up with his cardiologist, Dr. Lewayne Bunting, in two weeks.  He was told Dr. Ladona Ridgel would take an x-ray, and he should bring this x-ray to Dr. Garen Grams office with him.  He was also instructed to follow up with Dr. Laneta Simmers at the [facility] office on Tuesday, January 03, 2001, at 9:15 a.m. DD:  12/13/00 TD:  12/14/00 Job: [id number]

## 2011-02-12 NOTE — Consult Note (Signed)
NAME:  Steven Yang, Steven Yang                        ACCOUNT NO.:  [id number]   MEDICAL RECORD NO.:  [id number]                   PATIENT TYPE:  UT   LOCATION:  XRAY                                 [facility]ACILITY:  [facility]   PHYSICIAN:  Sondra Come, D.O.                 DATE OF BIRTH:  [birth year]   DATE OF CONSULTATION:  05/31/2002  DATE OF DISCHARGE:  03/26/2002                                   CONSULTATION   The patient returns to clinic today as scheduled for reevaluation.  He was  last seen on April 30, 2002 at which time he underwent a right intra-  articular hip injection for severe osteoarthritis of his right hip.  The  patient states that he had nearly complete relief of his right hip pain for  approximately three days but then his pain returned to baseline after  becoming more active.  His pain today is a 7/10 on a subjective scale and  intermittent.  He typically notices pain in his right anterior hip, medial  thigh to his right knee with mowing the lawn as well as playing golf which  he continues to do.  He is not interested in any type of surgical  intervention for his severe osteoarthritis of his hip.  He is essentially  satisfied with his current level of functioning and pain.  He does not want  to add any more medications nor does he want to undergo anymore  interventional procedures.  I reviewed the health and history form and 14  point review of systems.   PHYSICAL EXAMINATION:  GENERAL:  Healthy male in no acute distress.  VITAL SIGNS:  Blood pressure 148/55, pulse 58, respirations 18, O2  saturation 97% on room air.  NEUROLOGIC:  Gait is normal with slight external rotation of the right hip.  Manual muscle testing is 5/5 bilateral lower extremities.  Sensory  examination is intact to light touch with the exception of a funny feeling  at the right lateral leg.  EXTREMITIES:  Examination of the hips reveals internal rotation less than  neutral on the right with external  rotation of 50 degrees.  Left hip  internal rotation is easily to neutral and slightly past neutral with  external rotation 70-80 degrees.  Examination of the knees is normal  bilaterally without any ligamentous instability.  Muscle stretch reflexes  are 2+/4 bilateral lower extremities.   IMPRESSION:  1. Severe osteoarthritis right hip.  2. Compensatory right knee pain.   PLAN:  1. Continue conservative measures.  Consider using over-the-counter anti-     inflammatory such as Advil or Aleve as needed.  2. Consider orthopedic referral for total hip arthroplasty.  The patient     wishes to hold off at this point.  3. Consider repeat right hip injection if symptoms worsen.  4. The patient is to return to clinic on an as needed basis.  The patient was educated on the above findings and recommendations and  understands.  No barriers to communication.                                               Sondra Come, D.O.    JJW/MEDQ  D:  05/31/2002  T:  05/31/2002  Job:  [id number]

## 2011-02-12 NOTE — H&P (Signed)
NAME:  Steven Yang, Steven Yang                        ACCOUNT NO.:  [id number]   MEDICAL RECORD NO.:  [id number]                   PATIENT TYPE:  INP   LOCATION:  0450                                 FACILITY:  [facility]   PHYSICIAN:  Gwen Pounds, M.D.                 DATE OF BIRTH:  1922/06/06   DATE OF ADMISSION:  05/04/2003  DATE OF DISCHARGE:                                HISTORY & PHYSICAL   PRIMARY CARE PHYSICIAN:  Mark A. Perini, M.D.   CARDIOLOGIST:  Doylene Canning. Ladona Ridgel, M.D.   CHIEF COMPLAINT:  Left foot cellulitis, 102 fever.   HISTORY OF PRESENT ILLNESS:  This is a 77 year old male with coronary artery  disease who got cut on the bottom of his foot approximately a week ago.  Pulled off the scab.  The right foot started getting red, swollen, and  painful, possibly Thursday night.  It has gotten worse rapidly with red  streaks up the leg, pain throughout the leg up to the groin and fever up to  102.  He finally listened to his wife and came to the ER for evaluation and  treatment.  Diagnosis:  cellulitis and being admitted.   PAST MEDICAL HISTORY:  1. Coronary artery disease.  2. Six-vessel CABG in 2002.  3. Left saphenous vein grafting.  4. Right total hip replacement in 2004.  5. Hypertension.  6. Postoperative atrial fibrillation.  7. Posttraumatic stress disorder.  8. BPH.  9. Urinary tract infection.  10.      Remote open cholecystectomy.   ALLERGIES:  None.   MEDICATIONS:  1. Lisinopril 20 daily.  2. Toprol XL 25 daily.  3. Digoxin 0.125 daily.  4. He is currently off Risperdal and Geodon.  5. He is on UroXatral daily.   SOCIAL HISTORY:  He is married, three children, six grandchildren, six great-  grandchildren.  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   FAMILY HISTORY:  Father died of coronary artery disease.  Mother died of old  age at 22.   REVIEW OF SYSTEMS:  All systems reviewed and negative except the patient  with  fever and chills, right lower extremity pain, swelling and redness, and  some dizziness over the last couple of days.  Otherwise, no chest pain, no  shortness of breath, no headache and no other symptoms at this current time.   PHYSICAL EXAMINATION:  VITAL SIGNS:  Temperature 97.0, blood pressure  129/67, heart rate 60, respiratory rate 20, 98% saturation on room air.  GENERAL:  Alert and oriented x3.  No acute distress.  HEENT:  Dentures.  PERL.  EOMI.  Cranial nerves II-XII grossly intact.  NECK:  No JVD.  No lymphadenopathy.  CARDIAC:  Regular.  Midline sternotomy.  PULMONARY:  Clear to auscultation bilaterally.  ABDOMEN:  Soft.  Open laparotomy scar noted.  EXTREMITIES:  Right foot swollen, warm,  red.  It starts under the foot,  wraps around up the ankle, some blotching is up the leg.  Right total hip  replacement scar noted.  Left SVG scar is noted.  Otherwise, normal left  leg.  There is some lymphadenopathy in the right groin.   LABORATORY DATA:  White count 7.4, hemoglobin 12.1, platelet count 177.  Sodium 136, potassium 4.3, chloride 104, bicarb 28, BUN 13, creatinine 1.0,  glucose 103.  LFTs are fine.  Blood cultures are pending.   ASSESSMENT:  This is a 77 year old male being admitted with right lower  extremity and foot cellulitis with pain up the leg and streaking up the leg.   PLAN:  1. Admit.  2. IV antibiotics.  3. IV fluids.  4. Follow up on blood cultures.  5. Add uric acid x1 to admission laboratories.  6. X-ray foot if not any better by the a.m.  7. Control blood pressure.  8. Control heart rate.                                                Gwen Pounds, M.D.    JMR/MEDQ  D:  05/04/2003  T:  05/04/2003  Job:  [id number]   cc:   Loraine Leriche A. Waynard Edwards, M.D.  [address]  [place]  [place] [address]  Fax: [phone]   Doylene Canning. Ladona Ridgel, M.D.
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
## 2011-02-12 NOTE — Discharge Summary (Signed)
NAMEVICTORHUGO, PREIS              ACCOUNT NO.:  [id number]   MEDICAL RECORD NO.:  [id number]          PATIENT TYPE:  INP   LOCATION:  5530                         FACILITY:  [facility]   PHYSICIAN:  Mark A. Perini, M.D.   DATE OF BIRTH:  Mar 09, 1922   DATE OF ADMISSION:  04/21/2009  DATE OF DISCHARGE:  04/28/2009                               DISCHARGE SUMMARY   DISCHARGE DIAGNOSES:  1. Recurrent Clostridium difficile colitis.  2. Right gluteal abscess, status post incision and drainage this      admission.  3. Atherosclerotic coronary artery disease, quiescent.  4. Recent left hip fracture, progressing well.  5. Chronic kidney disease, stable at this time.  6. Hypertension.  7. Mild Alzheimer dementia.   PROCEDURE:  General surgery consultation and incision and drainage of  right gluteal abscess.   DISCHARGE MEDICATIONS:  1. Aspirin 325 mg daily.  2. Fluoxetine 40 mg daily.  3. Galantamine 8 mg twice a day.  4. One half of a 12.5 mg Carvedilol pill twice a day.  5. One 400 mg magnesium oxide pill daily.  6. Tylenol 500 mg every 6 hours as needed.  7. Florastor twice daily.  8. Vancomycin suspension 500 mg four times daily for 1 week and then      stop and then he will be pulse dosed after this for a few more      intervals to try to clear his C. diff colitis.  9. He is to apply Bactroban ointment three times a day to his right      gluteal wound and he is have sitz baths three times a day and cover      with a dry gauze for his right gluteal abscess.  10.He is on 40 mg of simvastatin each evening as well and should      continue this.   HISTORY OF PRESENT ILLNESS:  Claris is a pleasant 77 year old gentleman  who recently had an admission for severe C. diff colitis.  He had acute  renal failure during that admission and was quite ill; however, he has  been home for the last 2-3 weeks.  Unfortunately, his diarrhea recurred  3-4 days prior to this admission and he also developed a  red swollen  area on his right buttocks.  He was seen in the office and felt to  require admission for his problems.   HOSPITAL COURSE:  Treysean was admitted to a regular bed.  He was placed on  intravenous and oral vancomycin.  His wound was treated with local  drainage and wound care.  He progressed fairly well.  His diarrhea  improved within 3-4 days of his admission and his wounds improved and by  April 28, 2009, he was deemed stable for discharge home.   DISCHARGE PHYSICAL EXAMINATION:  Temperature 97.6, afebrile, pulse 80,  respiratory rate 20, blood pressure 134/77, 96% saturation on room air.  He was in no acute distress.  He was alert and oriented.  Lungs were  clear to auscultation bilaterally.  Heart was regular rate and rhythm  with no murmur or gallop.  Abdomen was soft, nontender, nondistended.  There was no edema.  His wound had very slight drainage and there was  much less redness and induration and erythema around the borders of the  wound than 2-3 days prior.   DISCHARGE LABORATORY DATA:  Vancomycin trough was 17.3 on April 27, 2009.  On April 27, 2009, sodium 139, potassium 4.1, chloride 103, CO2  29, BUN 9, creatinine 0.83, GFR greater than 60, glucose 90.  LFTs  normal with the exception of an albumin of 2.9.  White count 7.1,  hemoglobin 11.5, platelet count 258,000, MCV 94.  He had essentially  normal differential abscess.  Culture grew methicillin-resistant  Staphylococcus aureus.   DISCHARGE INSTRUCTIONS:  Gurney is to follow up in our office in 1 week.  He is to follow up on August 10 at [facility].  He is to  call if he has any recurrent problems.  He is to increase his activity  slowly and use his walker and he is to follow a low-salt diet.      Mark A. Perini, M.D.  Electronically Signed     MAP/MEDQ  D:  05/01/2009  T:  05/02/2009  Job:  [id number]

## 2011-02-12 NOTE — Op Note (Signed)
   TNAMEClarnce, Homan                         ACCOUNT NO.:  [id number]   MEDICAL RECORD NO.:  [id number]                   PATIENT TYPE:   LOCATION:                                       FACILITY:   PHYSICIAN:  Sondra Come, D.O.                 DATE OF BIRTH:   DATE OF PROCEDURE:  04/30/2002  DATE OF DISCHARGE:                                 OPERATIVE REPORT   PROCEDURE:  Right intra-articular hip injection.   IMPRESSION:  Severe osteoarthritis right hip.   DESCRIPTION OF PROCEDURE:  The procedure was described to patient in detail  including risks, benefits, limitations and alternatives.  The patient wishes  to proceed.  Informed consent was obtained.  The patient was brought back to  the fluoroscopy suite and placed on the table in supine position.  The right  femoral artery was palpated and marked.  Skin was prepped and draped in the  usual sterile fashion.  Skin and subcutaneous tissues were anesthetized with  3 cc of 1% lidocaine.  Under direct fluoroscopic guidance, a 22-gauge 3-1/2  inch spinal needle was advanced to the right femoral neck lateral to the  femoral artery.  Bony contact was made.  Needle tip placement was confirmed  under multiple fluoroscopic views.  Further confirmation was obtained after  negative aspiration with the injection of 0.5 cc of Omnipaque 180 revealing  a right hip arthrogram.  There was no vascular uptake noted.  This was then  followed by the injection of 1 cc of Kenalog 40 mg/cc plus 2 cc of 1%  lidocaine plus 2 cc of 0.25% bupivacaine.  There were no complications.  The  patient tolerated the procedure well.  Discharge instructions given.  The  patient notes complete relief of his symptoms post injection.                                                Sondra Come, D.O.    JJW/MEDQ  D:  05/10/2002  T:  05/12/2002  Job:  [id number]

## 2011-02-12 NOTE — Cardiovascular Report (Signed)
[place]. [facility]  Patient:    Steven Yang, Steven Yang                     MRN: [id number] Proc. Date: 12/08/00 Adm. Date:  [id number] Attending:  Lewayne Bunting CC:         Cardiac Catheterization Lab  Mark A. Perini, M.D.   Cardiac Catheterization  INDICATIONS:  Steven Yang is a pleasant 77 year old gentleman who presented to the emergency room with ongoing chest pain.  EKG suggested ST elevation. He had had ongoing pain for 36 hours and was subsequently brought to the cardiac catheterization laboratory for further evaluation.  PROCEDURES: 1. Left heart catheterization. 2. Selective coronary arteriography. 3. Selective left ventriculography. 4. Subclavian angiography. 5. Insertion of intra-aortic balloon pump.  DESCRIPTION OF PROCEDURE:  The procedure was performed from the right femoral artery using 6-French catheters.  He tolerated the procedure well.  I brought Dr. Charlies Constable to the catheterization laboratory, and we reviewed the angiographic studies.  In the catheterization laboratory, the patient had some very mild ongoing chest pain.  He had TIMI-2 flow in the infarct-related artery.  Because of this, we elected to insert an intra-aortic balloon pump, and surgical consultation was obtained.  The intra-aortic balloon pump was subsequently placed and placed on 1:1 with good augmentation.  He was taken to the holding area in satisfactory clinical condition.  HEMODYNAMIC DATA:  The central aortic pressure was 118/68.  LV pressure 122/35.  There was no gradient on pullback across the aortic valve.  ANGIOGRAPHIC DATA:  The coronary arteries generally were calcified, particularly in the proximal LAD territory. 1. The left main coronary artery had about a 30% area of mid narrowing. 2. The LAD had about a 50% area prior to the origin of the first diagonal.    The first diagonal itself had about 70% ostial narrowing.  Following this,    there was  subtotal occlusion of 99% narrowing leading into the origin of    the second diagonal.  The second diagonal itself had about 70% proximal    narrowing.  The distal vessel was a good vessel for bypass with about a    60-70% area of narrowing just beyond the diagonal but distally suitable    for grafting.  The second diagonal was also reasonable for grafting. 3. The circumflex proper had about a 70% stenosis overlapping the origin of    the first marginal.  The first marginal itself had about a 75% area of    upward takeoff stenosis.  The distal vessel was suitable for grafting.  The    AV circumflex had an additional 50% area of stenosis distally leading into    the second posterolateral branch. 4. The right coronary artery had about a 90% area of long diffuse segmental    narrowing near the junction of the proximal and mid vessel.  While not well    seen, there was damping of the catheter at its insertion site suggesting    up to about 50% narrowing at the ostium.  There was a 50% distal stenosis.    The posterior descending appeared to be suitable for grafting.  Ventriculography in the RAO projection reveals preserved global systolic function with an ejection fraction of 41%.  The subclavian was widely patent as is the internal mammary.  CONCLUSIONS: 1. Moderate reduction in global left ventricular function with an    anteroapical wall motion abnormality and ejection fraction of 41%. 2.  Patent subclavian vessel with patent internal mammary artery. 3. Subtotal occlusion of the mid left anterior descending artery with disease    of the left main and proximal LAD as described above. 4. Moderate disease of the circumflex coronary artery that is not clinically    significant at the moment. 5. High-grade stenosis of the proximal right coronary artery as described    above.  DISPOSITION:  The patient has some ongoing pain.  The intra-aortic balloon pump was inserted because of the ongoing  pain and TIMI-2 flow in the infarct-related artery.  While the infarct-related artery could be dilated, there are numerous issues with regard to long-term patency in this distribution due to both calcification, segmentality of the disease, and location of the disease involving the bifurcation location.  In addition, with the high-grade right disease, it is felt that revascularization surgery offers him the best chance at long-term outcome.  It was felt that the timing of this would be optimal more early because of stuttering ischemia.  I reviewed the plans with the patients wife, and the procedure was observed by his granddaughter, Larita Fife, who is a Advertising account planner. DD:  12/08/00 TD:  12/08/00 Job: [id number]

## 2011-02-12 NOTE — Discharge Summary (Signed)
NAME:  SILVANO, GAROFANO                        ACCOUNT NO.:  [id number]   MEDICAL RECORD NO.:  [id number]                   PATIENT TYPE:  INP   LOCATION:  0450                                 FACILITY:  [facility]   PHYSICIAN:  Mark A. Perini, M.D.                DATE OF BIRTH:  1921/10/09   DATE OF ADMISSION:  05/04/2003  DATE OF DISCHARGE:  05/06/2003                                 DISCHARGE SUMMARY   DISCHARGE DIAGNOSES:  1. Right lower extremity cellulitis, improving at the time of discharge.  2. Atherosclerotic coronary artery disease, stable at this time.  3. Hypertension.  4. Benign prostatic hypertrophy.   PROCEDURE:  None.   DISCHARGE MEDICATIONS:  The patient is to resume all of his previous home  medications, which include lisinopril 20 mg daily, Toprol XL 25 mg daily,  digoxin 0.125 mg daily, and UroXatral 10 mg daily.  He is to take Augmentin  XR two tablets twice a day with food to complete a full course of therapy.  He needs to take Vicodin 1-2 tablets q.6h. as needed for pain.   HISTORY OF PRESENT ILLNESS:  Mr. Laduca is a 77 year old male who cut the  bottom of his foot one week prior to admission.  He pulled off the scab at a  later time.  At that point, the right foot began to swell and become red and  painful.  This worsened quickly with streaks up his right leg, and he  developed fever to 102.  He presented to the emergency room on May 04, 2003, and was admitted for further care.   HOSPITAL COURSE:  Mr. Grumbine was admitted and placed on intravenous  antibiotics.  He was treated with oxacillin.  Over the next two days, he  showed gradual improvement in his fever curve and in his lower extremity.  On the day of discharge, he did have some swelling and redness still of his  right ankle and right leg, but it was improved from admission, and he was  deemed stable for discharge home.   DISCHARGE PHYSICAL EXAMINATION:  VITAL SIGNS:  Temperature 97.7; he  had been  afebrile for 24 hours.  Pulse 80, respiratory rate 20, blood pressure  147/64, 92% saturation on room air.  GENERAL:  He was in no acute distress.  EXTREMITIES:  Right ankle and right leg had some redness and edema and mild  warmth.   LABORATORY DATA:  On May 05, 2003, white count was 8, hemoglobin 11.6,  platelet count 181,000.  On May 05, 2003, sodium was 138, potassium 3.7,  chloride 105, CO2 of 27, glucose 106, BUN 11, creatinine 1.2, calcium 8.5.  Blood cultures remained negative x5 days.  He also had a plain x-ray of his  foot that showed no evidence of osteomyelitis.   DISCHARGE INSTRUCTIONS:  1. He is to be up as tolerated.  2. He is  to follow a low-fat, low-cholesterol diet.  3.     He is to put warm compresses on his right ankle and leg two to three times a     day.  4. He is to call if he has any recurrent problems.  5. He will follow up with [place] in one week after discharge.                                               Mark A. Waynard Edwards, M.D.    MAP/MEDQ  D:  05/21/2003  T:  05/21/2003  Job:  [id number]   cc:   Doylene Canning. Ladona Ridgel, M.D.

## 2011-02-12 NOTE — Op Note (Signed)
NAME:  Steven Yang, Steven Yang                        ACCOUNT NO.:  [id number]   MEDICAL RECORD NO.:  [id number]                   PATIENT TYPE:  INP   LOCATION:  5014                                 FACILITY:  [facility]   PHYSICIAN:  Loreta Ave, M.D.              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   PROCEDURE:  Right total hip replacement utilizing Osteonics prosthesis.  A  56 mm PSL acetabular cup, screw fixation x2, and a 10 degree polyethylene 28  mm Crossfire liner.  A cemented #8 Eon Plus femoral component with a 12 mm  distal tip.  A 127 degree neck angle, 28 mm +10 head.   SURGEON:  Loreta Ave, M.D.   ASSISTANT:  Arlys John D. Petrarca, P.A.-C.   ANESTHESIA:  General.   ESTIMATED BLOOD LOSS:  350 mL.   BLOOD REPLACED:  None.   SPECIMENS:  Excised bone and soft tissue.   CULTURES:  None.   COMPLICATIONS:  None.   DRESSING:  Soft compressive with abduction pillow.   DESCRIPTION OF PROCEDURE:  The patient was brought to the operating room and  placed on the operating table in the supine position.  After adequate  anesthesia had been obtained, turned to a lateral position with appropriate  padding and support.  Prepped and draped in the usual sterile fashion.  An  incision along the femur extending posterior superior.  Skin and  subcutaneous tissue divided.  The iliotibial band exposed, incised, and  Charnley retractor put in place.  Hemostasis with cautery.  Neurovascular  structures identified and protected.  External rotator and capsule taken  down off the back of the intertrochanteric groove on the femur.  Dislocated  posteriorly.  Femoral head and  stem have grade 4 changes throughout.  Very  shallow acetabulum.  Femoral head removed one fingerbreadth above the lesser  trochanter in line with the definitive component.  Acetabulum exposed.  Debris cleared.  Reaming with medial and inferior emphasis to recreate a  deeper acetabulum for the acetabular component.  Sized for a 56 mm component  after I got up to good spherical acetabulum with bleeding bone.  Definitive  component was placed in 45 degrees of abduction and 20 degrees of  anteversion.  Augmented with two screws placed through the cup, a 20 and a  16 mm, which were pre-drilled and then countersunk.  A 10 degree  polyethylene insert placed with the overhang placed posterosuperior.  Attention turned to the femur.  Sequential reaming proximally and distally  with the hand-held and power reamers.  He had virtually no anteversion.  Care was taken to create some  anteversion for stability of the components.  Sized for a #8 cemented component.  Series of trials with the #8 component,  127 degree neck angle.  With the +10 head, I had good restoration of leg  lengths, good stability in flexion and extension.  All trials removed.  Cement restrictor size #4 placed distal to the definitive component.  Copious irrigation with the pulse irrigating device.  The femoral component  was assembled with the 12 mm distal tip, which had been pre-sized.  It was  then cemented in the femur under pressurized technique, recreating 10-15  degrees of anteversion in the femur.  Once the cement had hardened and  excessive cement removed, the +10 head was attached.  The hip was reduced.  Excellent stability in flexion-extension with good restoration of leg  lengths.  External rotator and capsule were repaired to the back of the  intertrochanteric groove through drill holes and sutures tied over a bony  bridge.  Charnley retractor removed.  The iliotibial band closed with #1  Vicryl, skin and subcutaneous  tissue with Vicryl and staples.  Margins of  the wound injected with Marcaine.  A sterile compressive dressing applied.  He was returned to the supine position.  Abduction pillow placed.  Anesthesia reversed.  Brought to the recovery room.  Tolerated the surgery  well with no complications.                                               Loreta Ave, M.D.    DFM/MEDQ  D:  11/29/2002  T:  11/30/2002  Job:  [id number]

## 2011-02-12 NOTE — Discharge Summary (Signed)
NAMELORANCE, PICKERAL              ACCOUNT NO.:  [id number]   MEDICAL RECORD NO.:  [id number]          PATIENT TYPE:  INP   LOCATION:  6707                         FACILITY:  [facility]   PHYSICIAN:  Larina Earthly, M.D.        DATE OF BIRTH:  [birth year]   DATE OF ADMISSION:  04/09/2008  DATE OF DISCHARGE:  04/14/2008                               DISCHARGE SUMMARY   DISCHARGE DIAGNOSES:  1. Left leg cellulitis, improved on intravenous antibiotics with      continuation of oral antibiotics on an outpatient basis.  2. Fever associated with #1 with some clinical findings consistent      with viral syndrome.  3. Hypertension complicated by hypotension on admission, now resolved      with reinitiation of blood pressure medications.   SECONDARY DIAGNOSES:  1. Coronary artery disease, status post coronary artery bypass      grafting.  2. History of bilateral lower extremity cellulitis on the right side      on August 2004 and left-sided on January 2005.  3. History of diverticulosis with hemorrhage.  4. History of posttraumatic stress disorder.  5. History of a heart murmur and need of subacute bacterial      endocarditis prophylaxis.  6. History of chronic gait disorder since injury in World War [place].  7. History of right total hip replacement 2004.  8. History of mild Alzheimer's dementia.  9. History of benign prostatic hypertrophy.  10.History of hyperlipidemia.  11.History of microcytic hematuria evaluated by Urology.   DISCHARGE MEDICATIONS:  1. Enteric-coated aspirin 325 mg each day.  2. Lisinopril half of a 40 mg tablet each day.  3. Atenolol 25 mg each day.  4. Prozac 40 mg each day.  5. Zocor half of an 80 mg tablet each day.  6. Fish oil and niacin supplements each day and new medications      include cephalexin 500 mg 1 tablet 3 times daily for 5 days.   The patient is to follow up with Dr. Waynard Edwards on an outpatient basis.   LABORATORY EVALUATION:  Admission white blood cell  count was 21.6  thousand and on discharge 6.6, hemoglobin 13.1 decreasing to 11.4 with  IV fluids, platelet count remained stable between 147 and 180.  Admission BUN was 21 with a creatinine of 1.27 and upon discharge was 6  and 0.96 respectively.  Electrolytes normal on discharge.  Prior to  discharge, total protein 5.6, albumin 2.6, AST 27, ALT 16, alkaline  phosphatase 57, total bilirubin 1.0.  Urinalysis unremarkable for  infection.  Blood cultures x2 unremarkable; however, this was after  obtaining Levaquin for 2 days.  Urine culture, no growth.  H1N1 viral  screen not detected by PCR.  Influenza A and B screen by PCR negative.  Chest x-ray reveals a streaky opacity at the left lung base suggestive  of atelectasis or scar.  Abdominal KUB reveals bowel gas pattern  compatible with mild ileus.  Lower extremity ultrasound negative for any  evidence of obvious DVT, thrombosis, or Baker cyst.   HISTORY OF PRESENT ILLNESS:  Please see my history and physical dictated  on April 09, 2008 for extensive details.  However, this is a 77 year old  Caucasian male with multiple medical problems who initially presented  which is fever and leukocytosis, nausea, vomiting, and general myalgias  consistent with a viral syndrome; however, within 24 hours represented  to our office with significant lower extremity cellulitis and was  subsequently hospitalized.  Again, please see history and physical for  details.   HOSPITAL COURSE:  The patient's antibiotics were changed from Levaquin  to Ancef and he was provided significant IV fluids for his refractory  nausea and vomiting.  KUB did reveal mild evidence of an ileus possibly  associated with his underlying infectious etiology.  From a hemodynamic  point of view, the patient has remained stable albeit with a slightly  low blood pressure and antihypertensive medications were slowly  reinitiated throughout his hospitalization.  He remained on DVT   prophylaxis and appeared slowly improved over the next 4-5 days with a  decreased redness, decreased pain, and a clinical improvement in the  patient's ability to take fluids and nutrition.  He was initially kept  on protocol for H1N1 given his presentation with fevers, nausea and  vomiting.  However, again his workup proved to be negative for the same.  He was deemed appropriate for discharge on April 14, 2008 with a close  followup with Dr. Waynard Edwards.      Larina Earthly, M.D.  Electronically Signed     RA/MEDQ  D:  06/13/2008  T:  06/13/2008  Job:  [id number]

## 2011-02-12 NOTE — Discharge Summary (Signed)
NAME:  Steven Yang, Steven Yang                        ACCOUNT NO.:  [id number]   MEDICAL RECORD NO.:  [id number]                   PATIENT TYPE:  INP   LOCATION:  5014                                 FACILITY:  [facility]   PHYSICIAN:  Loreta Ave, M.D.              DATE OF BIRTH:  1922-07-07   DATE OF ADMISSION:  11/28/2002  DATE OF DISCHARGE:  12/02/2002                                 DISCHARGE SUMMARY   ADMISSION DIAGNOSIS:  Advanced degenerative joint disease of the right hip.   DISCHARGE DIAGNOSES:  1. Advanced degenerative joint disease of the right hip.  2. Hypertension.  3. Edema of the male genitalia.   PROCEDURE:  A right total hip replacement.   HISTORY:  This 77 year old male with unrelenting right hip and groin  symptoms.  It has progressed to the point where he has had to walk with a  walker.  He has now failed with conservative treatment.  He is now indicated  for a right total hip replacement.   HOSPITAL COURSE:  This 77 year old male admitted on November 28, 2002, and after  the appropriate laboratory studies were obtained, as well as Ancef 1 g IV on  call to the operating room, he was taken to the operating room where he  underwent a right total hip replacement.  He tolerated the procedure well.  He was continued postoperatively on Ancef 1 g IV q.8h. for three doses.  Heparin 5000 units subcu q.12h. was placed, until his Coumadin became  therapeutic.  A Dilaudid PAC pump was used for pain management.  A  consultation with PT, OT and rehab were made.  Ambulation and weightbearing  as tolerated on the right with a walker.  Total hip precaution protocol.  He  was initially placed on 5500.  The Foley was discontinued accidentally on  the first postoperative day.  This was reinserted on the fourth.  Toprol and  Prinivil were stopped secondary to hypotension.  On the fifth a type and  cross for 2 units of packed cells, and this was given 2 units over three to  four hours  each.  He did well with the blood transfusion.  His PCA was  discontinued on the sixth, and his artery was placed with a normal saline  lock.  The remainder of his hospital course was uneventful.   DISPOSITION:  He was discharged on December 02, 2002, to return back to the  office in followup.   LABORATORY DATA:  RADIOGRAPHIC STUDIES:  Right hip reveals status post right  total hip replacement.  Admission hemoglobin 14.1, hematocrit 40.5%, white count 7600, platelets  231,000.  Discharge hemoglobin 10.0, hematocrit 28.3%.  Two units of blood  were given.  Pro-time at discharge was 17.0 with an INR of 1.4.  Chemistries  preoperatively:  Sodium 136, potassium 4.3, chloride 101, CO2 of 28, glucose  93, BUN 9, creatinine 1.0, calcium 9.7.  Total protein 6.5, albumin 3.8, AST  16, ALT 12, ALP 73, total bilirubin 1.4.  Discharge sodium 135, potassium  4.0, chloride 103, CO2 of 28, glucose 168, BUN 7, creatinine 1.0, calcium  8.4.  Urinalysis was benign for a voided urine.  Blood type was O-positive,  antibody screen negative.   DISCHARGE MEDICATIONS:  1. He was given a prescription for Percocet 5/225 mg, one to two tab q.4h.     p.r.n. pain.  2. Coumadin as per Genevieve Norlander.  3. Lanoxin 0.125 mg daily.  4. Toprol 25 mg daily.  5. Lisinopril 20 mg 1/2 tab daily, to be clarified by the cardiologist.   DISCHARGE INSTRUCTIONS:  He will follow total hip precautions.  Keep the  wound clean and dry.  Do not get the incision wet.  Follow the blue  postoperative total hip replacement sheet.   FOLLOW UP:  Follow up back in the office on December 11, 2002.   CONDITION ON DISCHARGE:  Improved condition.      Oris Drone Petrarca, P.A.-C.                Loreta Ave, M.D.    BDP/MEDQ  D:  12/31/2002  T:  01/01/2003  Job:  [id number]

## 2011-02-12 NOTE — Consult Note (Signed)
   NAME:  Steven Yang, Steven Yang NO.:  [id number]   MEDICAL RECORD NO.:  [id number]                  PATIENT TYPE:   LOCATION:                                       FACILITY:   PHYSICIAN:  Sondra Come, D.O.                 DATE OF BIRTH:   DATE OF CONSULTATION:  04/30/2002  DATE OF DISCHARGE:                  PHYSICAL MEDICINE & REHABILITATION CONSULTATION   HISTORY OF PRESENT ILLNESS:  The patient return to clinic today as scheduled  for a right intra-articular hip injection.  The patient was last seen on  April 25, 2002.  He continues to have right hip pain.  His pain is 8/10 on a  subjective scale.  He has severe osteoarthritis of the right hip.  Denies  any radicular component.  I reviewed health and history form and 14-point  review of systems.   PHYSICAL EXAMINATION:  VITAL SIGNS:  Blood pressure 134/50, pulse 54,  respirations 14, O2 saturation 100% on room air.   IMPRESSION:  Severe osteoarthritis right hip.   PLAN:  1. Right intra-articular hip injection.  2. The patient is to return to clinic in one month for reevaluation.  Will     consider repeating injection as predicated upon the patient's symptoms     and response.                                               Sondra Come, D.O.    JJW/MEDQ  D:  04/30/2002  T:  05/04/2002  Job:  [id number]

## 2011-02-12 NOTE — Consult Note (Signed)
NAME:  KATO, WIECZOREK NO.:  [id number]   MEDICAL RECORD NO.:  [id number]                  PATIENT TYPE:   LOCATION:                                       FACILITY:   PHYSICIAN:  Sondra Come, D.O.                 DATE OF BIRTH:   DATE OF CONSULTATION:  DATE OF DISCHARGE:                  PHYSICAL MEDICINE & REHABILITATION CONSULTATION   HISTORY OF PRESENT ILLNESS:  The patient returns to clinic today for  reevaluation.  He was initially seen on March 26, 2002.  He continues to have  significant right hip and groin pain with ambulation.  He has been to  physical therapy without any improvement.  X-rays of the right hip reveal  marked osteoarthritis with prominent degenerative changes of the femoral and  acetabular regions with marked narrowing of the articular space.  The  patient states he tried to play golf the other day but had significant pain  with walking.  Essentially no changes.  He has discontinued taking over-the-  counter ibuprofen stating that he did not want to become dependent on it.  He does state that it helped him to some degree.  I reviewed health and  history form for a 14-point review of systems.  The pain today is an 8/10 on  a subjective scale.  Function and quality of life indices remain essentially  the same.  The patient continues to try to be active despite his pain.   PHYSICAL EXAMINATION:  GENERAL:  Healthy male in no acute distress.  VITAL SIGNS:  Blood pressure 128/50, pulse 61, respirations 12, O2  saturation is 99% on room air.  NEUROLOGIC:  Gait is slightly antalgic on the right.  Neurologically intact  in the lower extremities including motor, sensory and reflexes.  Range of  motion of the hips reveals marked decreased internal and external rotation  on the right compared to the left with reproduction of patient's groin and  hip pain on the right.   IMPRESSION:  Severe osteoarthritis, right hip.   PLAN:  1. Had  a long discussion with the patient regarding treatment options.  At     this point, it is reasonable to proceed with a right intra-articular hip     injection.  If this does not give the patient any significant benefit, we     will refer him back to Dr. Chaney Malling, orthopedics, to discuss right total     knee arthroplasty.  The patient wishes to proceed in this regard as he     wants to avoid surgery if possible.  2. Will give samples of Bextra 10 mg one p.o. daily.  I have instructed the     patient to increase to two p.o. daily if, after three or four days,     symptoms have not improved.  Thirty samples given.  3. The patient to return to clinic for right intra-articular hip injection.   The patient was educated  on the above findings and recommendations and  understands.  No barriers to communication.                                               Sondra Come, D.O.    JJW/MEDQ  D:  04/25/2002  T:  04/28/2002  Job:  [id number]   cc:   Doylene Canning. Ladona Ridgel, M.D. [facility]

## 2011-02-12 NOTE — Op Note (Signed)
[place]  Patient:    Steven Yang, Steven Yang                     MRN: [id number] Proc. Date: 12/08/00 Adm. Date:  [id number] Attending:  Cleatrice Burke CC:         CVTS Office  [facility]  Cath Lab   Operative Report  PREOPERATIVE DIAGNOSES:  Severe three-vessel coronary artery disease with acute anteroseptal myocardial infarction.  POSTOPERATIVE DIAGNOSES:  Severe three-vessel coronary artery disease with acute anteroseptal myocardial infarction.  OPERATIVE PROCEDURE:  Emergency median sternotomy, extracorporeal circulation, coronary artery bypass graft surgery x 6 using a left internal mammary artery graft to the left anterior descending artery with a saphenous vein graft to the second diagonal branch of the left anterior descending artery, saphenous vein graft to the right coronary artery and a sequential saphenous vein graft to the first diagonal branch of the left anterior descending artery, obtuse marginal branch and posterolateral branches of the left circumflex coronary artery.  SURGEON:  Alleen Borne, M.D.  ASSISTANT:  Arman Bogus, P.A.-C., Dominica Severin, P.A.-C.  ANESTHESIA:  General endotracheal.  CLINICAL HISTORY:  The patient is a 77 year old gentleman with no prior cardiac history who was admitted this morning from the emergency room after presenting with a 36-hour history of ongoing chest pain and acute anteroseptal MI. His first CPK was 646 with an MB fraction of 93.0 and his second CPK was 779 with an MB fraction of 123.0 this morning. He underwent cardiac catheterization by Dr. Riley Kill which showed about 30% left internal mammary artery stenosis. The left anterior descending artery had 50% proximal stenosis and a 99% proximal stenosis at the takeoff of the second diagonal branch. There was slow flow down the distal left anterior descending artery. The first diagonal and second diagonal both had 70%  proximal stenoses. The left anterior descending artery had about 60-70% mid vessel stenosis. The left circumflex coronary artery had a large marginal branch that had 75% proximal stenosis and there was about 70% stenosis in the left circumflex at the takeoff of this obtuse marginal branch. The distal left circumflex had about 50% stenosis before a large posterolateral branch. The right coronary artery had 90% proximal stenosis and 50% distal stenosis before the posterior descending branch. The left ventricular ejection fraction was 41% with severe anterior hypokinesis. The patient had an intra-articular balloon pump placed in the catheterization laboratory for ongoing chest pain and became pain free after that.  After review of the angiogram and examination of the patient, it was felt that the best treatment was to proceed with emergency coronary artery bypass graft surgery. I discussed the operative procedure with the patient and his family including alternatives, benefits and risks including bleeding, possible blood transfusion, infection, stroke, myocardial infarction and death. They understood and agreed to proceed.  DESCRIPTION OF PROCEDURE:  The patient was taken to the operating room and placed on the table in the supine position. After induction of general endotracheal anesthesia, a Foley catheter was placed in the bladder using sterile technique. Then the chest, abdomen and both lower extremities were prepped and draped in the usual sterile manner.  The chest was entered through a median sternotomy incision and the pericardium opened in the midline. Examination of the heart showed severe hypokinesis of the anterior wall. The right ventricle appears somewhat hypokinetic. The ascending aorta had no palpable plaques in it.  Then the left internal mammary artery was harvested from  the chest wall as a pedicle graft. This was a medium caliber vessel with excellent blood  flow through it. At the same time a segment of the greater saphenous vein was harvested from the left lower extremity and this vein was of medium size and of good quality.  Then the patient was heparinized and when an adequate activated clotting time was achieved, the distal ascending aorta was cannulated using a #20 [place] aortic cannula for arterial inflow. Venous outflow was achieved using a two-stage venous cannula through the right atrial appendage. An antegrade cardioplegia and vent cannula was inserted in the aortic root.  The patient was placed on cardiopulmonary bypass and the distal coronaries were identified. The left anterior descending artery was a large graftable vessel. The first diagonal and second diagonal branches were medium sized graftable vessels. The obtuse marginal and posterolateral branches were large vessels. The right coronary artery was diffusely diseased but up about 1 cm before the takeoff of the posterior descending artery the right coronary artery was free of disease.  Then the aorta was cross clamped and 500 cc of cold blood antegrade cardioplegia was administered in the aortic root with quick arrest of the heart. Systemic hypothermia to 20 degrees Centigrade and topical hypothermia of ice saline was used. A temperature probe was placed in the septum inside and a pad in the pericardium.  The first distal anastomosis was performed to the first diagonal branch. The internal diameter was 1.6 mm. The conduit used was a segment of the greater saphenous vein and the anastomosis was performed in sequential side-to-side manner using continuous 7-0 Prolene suture. Flow was administered through the graft and it was excellent.  The second distal anastomosis was performed to the obtuse marginal branch. The internal diameter was 1.75 mm. The conduit used was a segment of the greater  saphenous vein and the anastomosis was performed in a sequential  side-to-side manner using continuous 7-0 Prolene suture. Flow was administered through the graft and was excellent.  The third distal anastomosis was performed to the posterolateral branch of the right coronary artery. The internal diameter was 1.75 mm. The conduit used was a segment of greater saphenous vein and the anastomosis was performed in a sequential end-to-side manner using continuous 7-0 Prolene suture. Flow was administered through the graft and it was excellent. Then another dose of cardioplegia was given down the vein grafts and the aortic root.  The fourth distal anastomosis was performed in the right coronary artery just before the takeoff of the posterior descending branch. The internal diameter was about 2.5 mm. The conduit used was a segment of the greater saphenous vein and the anastomosis was performed end-to-side manner using continuous 7-0 Prolene suture. Flow was administered through the graft and it was excellent.  The fifth distal anastomosis was performed to second diagonal branch. The internal diameter was 1.6 mm. The conduit used was a segment of greater saphenous vein and anastomosis was performed in an end-to-side manner using continuous 7-0 Prolene suture. Flow was measured through the graft and was excellent. Then another dose of cardioplegia was given down the vein grafts and then the aortic root.  The sixth distal anastomosis was performed to the mid portion of the left anterior descending artery. The internal diameter was about 2 mm. The conduit used was a left internal mammary artery and this was brought through an opening in the left pericardium anterior to the phrenic nerve. It was anastomosed to the left anterior descending artery in end-to-side  manner using continuous 8-0 Prolene suture. The pedicle was tacked to the epicardium with 6-0 Prolene suture.  The patient was rewarmed to 37 degrees Centigrade and the clamp removed from the mammary  pedicle. There was rapid rewarming of the ventricular septum and return of spontaneous ventricular fibrillation. The cross clamped was removed with a time of 70 minutes and the patient defibrillated into sinus rhythm.  A partial occlusion clamp was placed on the aortic root and the proximal vein graft anastomoses were performed in end-to-side manner using continuous 6-0 Prolene suture. The clamp was removed, the vein grafts deaired and the clamps removed from the proximal and distal anastomoses, appeared hemostatic and the line of the grafts satisfactory. A graft monitor was placed on the proximal anastomoses.  Two temporary right ventricular and right atrial pacing wires were placed and brought out through the skin.  When the patient had rewarmed to 37 degrees Centigrade, he was weaned from cardiopulmonary bypass on low dose Dopamine with the intra-aortic balloon pump in place. Total bypass time was 109 minutes. Cardiac function appeared good with a cardiac output of 4-5 L per minute. Protamine was given and the venous and aortic cannulas were removed without difficulty. Hemostasis was achieved. The patient was given 8 units of platelets for coagulopathy and did receive Integrilin preoperatively. Three chest tubes were placed with one in the posterior pericardium, one in the left pleural space and one in the anterior mediastinum. The pericardium was reapproximated to the heart. The sternum was closed with #6 stainless steel wires. The fascia was closed with continuous #1 Vicryl suture. The subcutaneous tissue was closed using continuous 2-0 Vicryl and the skin with 3-0 Vicryl subcuticular closure. The lower extremity venous harvest site was closed in layers in a similar manner. The sponge, needle, lap and instrument counts were correct as according to the scrub nurse. Dry, sterile dressings were applied over the incisions and around the chest tubes which were hooked to Pleur-Evac suction.  The patient remained hemodynamically stable and was transported to the SICU in guarded but stable condition. DD:  12/08/00 TD:  12/09/00 Job: [id number]

## 2011-03-25 ENCOUNTER — Encounter (HOSPITAL_COMMUNITY): Payer: Medicare Other | Attending: Internal Medicine

## 2011-03-25 DIAGNOSIS — M810 Age-related osteoporosis without current pathological fracture: Secondary | ICD-10-CM | POA: Insufficient documentation

## 2011-06-24 LAB — COMPREHENSIVE METABOLIC PANEL
ALT: 23
AST: 34
CO2: 27
Chloride: 99
Creatinine, Ser: 1.27
GFR calc Af Amer: >60
GFR calc non Af Amer: 54 — ABNORMAL LOW
Glucose, Bld: 111 — ABNORMAL HIGH
Sodium: 134 — ABNORMAL LOW
Total Bilirubin: 1.1

## 2011-06-24 LAB — CBC
Hemoglobin: 13.1
MCV: 97.6
RBC: 3.89 — ABNORMAL LOW
WBC: 21.6 — ABNORMAL HIGH

## 2011-06-24 LAB — CULTURE, BLOOD (ROUTINE X 2): Culture: NO GROWTH

## 2011-06-25 LAB — COMPREHENSIVE METABOLIC PANEL
ALT: 16
Alkaline Phosphatase: 57
CO2: 28
Chloride: 100
GFR calc non Af Amer: 50 — ABNORMAL LOW
Glucose, Bld: 100 — ABNORMAL HIGH
Potassium: 3.7
Sodium: 136
Total Bilirubin: 1
Total Protein: 5.6 — ABNORMAL LOW

## 2011-06-25 LAB — URINE CULTURE: Culture: NO GROWTH

## 2011-06-25 LAB — URINALYSIS, ROUTINE W REFLEX MICROSCOPIC
Glucose, UA: NEGATIVE
Ketones, ur: NEGATIVE
Leukocytes, UA: NEGATIVE
Protein, ur: NEGATIVE

## 2011-06-25 LAB — CBC
HCT: 33.2 — ABNORMAL LOW
HCT: 33.9 — ABNORMAL LOW
Hemoglobin: 11.4 — ABNORMAL LOW
Hemoglobin: 11.6 — ABNORMAL LOW
Hemoglobin: 11.7 — ABNORMAL LOW
Hemoglobin: 11.8 — ABNORMAL LOW
MCV: 97
Platelets: 147 — ABNORMAL LOW
Platelets: 158
RBC: 3.51 — ABNORMAL LOW
RDW: 14
RDW: 14.2
WBC: 15.8 — ABNORMAL HIGH
WBC: 6.6
WBC: 7.3

## 2011-06-25 LAB — BASIC METABOLIC PANEL
BUN: 11
BUN: 6
Calcium: 8.8
Chloride: 108
GFR calc Af Amer: >60
GFR calc non Af Amer: 57 — ABNORMAL LOW
GFR calc non Af Amer: >60
Glucose, Bld: 102 — ABNORMAL HIGH
Glucose, Bld: 93
Potassium: 4
Potassium: 4.1
Sodium: 138
Sodium: 139

## 2011-10-26 ENCOUNTER — Encounter: Payer: Self-pay | Admitting: Cardiology

## 2011-10-26 ENCOUNTER — Ambulatory Visit (INDEPENDENT_AMBULATORY_CARE_PROVIDER_SITE_OTHER): Payer: Medicare Other | Admitting: Cardiology

## 2011-10-26 VITALS — BP 140/62 | HR 56 | Ht 68.0 in | Wt 186.6 lb

## 2011-10-26 DIAGNOSIS — I2510 Atherosclerotic heart disease of native coronary artery without angina pectoris: Secondary | ICD-10-CM

## 2011-10-26 DIAGNOSIS — E785 Hyperlipidemia, unspecified: Secondary | ICD-10-CM

## 2011-10-26 DIAGNOSIS — Z951 Presence of aortocoronary bypass graft: Secondary | ICD-10-CM

## 2011-10-26 DIAGNOSIS — E7800 Pure hypercholesterolemia, unspecified: Secondary | ICD-10-CM

## 2011-10-26 DIAGNOSIS — I10 Essential (primary) hypertension: Secondary | ICD-10-CM

## 2011-10-26 NOTE — Assessment & Plan Note (Signed)
He is on simvastatin and fish oil. His fasting lab work is followed by Dr. Waynard Edwards.

## 2011-10-26 NOTE — Assessment & Plan Note (Signed)
Blood pressure is well controlled on exam today. 

## 2011-10-26 NOTE — Assessment & Plan Note (Signed)
He is status post remote CABG in 2002. He is asymptomatic. Given his advanced age and dementia we will continue conservative therapy.

## 2011-10-26 NOTE — Patient Instructions (Signed)
Continue your current medications.  Get more exercise  I will see you again in 1 year.

## 2011-10-26 NOTE — Progress Notes (Signed)
Steven Yang Date of Birth: August 20, 1922 Medical Record #[id number]  History of Present Illness: Steven Yang is seen for yearly followup. He is status post CABG in 2002. He had a nuclear stress test in 2008 which was normal. He has had no significant anginal symptoms. He does have dementia. His wife reports that he is very inactive and sits in a chair most of the day. He denies any shortness of breath or chest pain. He denies any palpitations.  Current Outpatient Prescriptions on File Prior to Visit  Medication Sig Dispense Refill  . aspirin 325 MG tablet Take 325 mg by mouth daily.        . carvedilol (COREG) 12.5 MG tablet Take 6.25 mg by mouth 2 (two) times daily.        Marland Kitchen FLUoxetine (PROZAC) 10 MG capsule Take 20 mg by mouth daily.        Marland Kitchen galantamine (RAZADYNE ER) 16 MG 24 hr capsule Take 16 mg by mouth daily.        Marland Kitchen LACTOBACILLUS PO Take 2 tablets by mouth 2 (two) times daily.        . Magnesium Oxide 420 MG TABS Take by mouth 2 (two) times daily.        . OMEGA 3 1000 MG CAPS Take 2 capsules by mouth daily.        . simvastatin (ZOCOR) 40 MG tablet Take 20 mg by mouth daily.        Marland Kitchen teriparatide (FORTEO) 750 MCG/3ML injection Inject 20 mcg into the skin daily.          Allergies  Allergen Reactions  . Aricept (Donepezil Hydrochloride)     Past Medical History  Diagnosis Date  . CAD (coronary artery disease)     2002 CABG  . HTN (hypertension)   . Hypercholesteremia   . C. difficile colitis   . Diverticulosis   . Alzheimer's disease MILD  . Dementia     Past Surgical History  Procedure Date  . Total hip arthroplasty   . Cholecystectomy   . Coronary artery bypass graft 11/2000     LIMA GRAFT TO LAD. SAPHENOUS VEIN GRAFT TO THE SECOND DIAGONAL, OM AND POSTERIOR LATERAL BRANCHES OF THE CIRCUMFLEX CORONARY    History  Smoking status  . Former Smoker  . Types: Cigarettes  . Quit date: 12/02/1983  Smokeless tobacco  . Not on file    History  Alcohol Use  No    Family History  Problem Relation Age of Onset  . Heart attack Father   . Coronary artery disease Father   . Cancer Brother   . Pneumonia Brother   . Other Brother     [employer]    Review of Systems: The review of systems is positive for sedentary lifestyle.  All other systems were reviewed and are negative.  Physical Exam: BP 140/62  Pulse 56  Ht 5\' 8"  (1.727 m)  Wt 186 lb 9.6 oz (84.641 kg)  BMI 28.37 kg/m2 He is an elderly white male in no acute distress. HEENT exam is unremarkable. He has no JVD or bruits. Lungs are clear. Cardiac exam reveals a regular rate and rhythm without gallop, murmur, or click. Abdomen is soft and nontender without organomegaly. Extremities are without edema. Pedal pulses are good. He is alert but his short-term memory is poor. LABORATORY DATA: ECG demonstrates sinus bradycardia with left axis deviation and a heart rate of 56 beats per minute. It is otherwise normal.  Assessment / Plan:

## 2012-04-07 ENCOUNTER — Encounter (HOSPITAL_COMMUNITY)
Admission: RE | Admit: 2012-04-07 | Discharge: 2012-04-07 | Disposition: A | Discharge status: Home / Self Care | Payer: Medicare Other | Source: Ambulatory Visit | Attending: Internal Medicine | Admitting: Internal Medicine

## 2012-04-07 ENCOUNTER — Encounter (HOSPITAL_COMMUNITY): Payer: Self-pay

## 2012-04-07 DIAGNOSIS — M810 Age-related osteoporosis without current pathological fracture: Secondary | ICD-10-CM | POA: Insufficient documentation

## 2012-04-07 MED ORDER — ZOLEDRONIC ACID 5 MG/100ML IV SOLN
5.0000 mg | Freq: Once | INTRAVENOUS | Status: AC
  Administered 2012-04-07: 5 mg via INTRAVENOUS
  Filled 2012-04-07: qty 100

## 2012-04-07 MED ORDER — SODIUM CHLORIDE 0.9 % IV SOLN
INTRAVENOUS | Status: AC
  Administered 2012-04-07: 13:00:00 via INTRAVENOUS

## 2012-09-24 ENCOUNTER — Emergency Department (HOSPITAL_BASED_OUTPATIENT_CLINIC_OR_DEPARTMENT_OTHER)
Admission: EM | Admit: 2012-09-24 | Discharge: 2012-09-24 | Disposition: A | Discharge status: Home / Self Care | Payer: Medicare Other | Attending: Emergency Medicine | Admitting: Emergency Medicine

## 2012-09-24 ENCOUNTER — Emergency Department (HOSPITAL_BASED_OUTPATIENT_CLINIC_OR_DEPARTMENT_OTHER): Payer: Medicare Other

## 2012-09-24 ENCOUNTER — Encounter (HOSPITAL_BASED_OUTPATIENT_CLINIC_OR_DEPARTMENT_OTHER): Payer: Self-pay | Admitting: *Deleted

## 2012-09-24 DIAGNOSIS — E7800 Pure hypercholesterolemia, unspecified: Secondary | ICD-10-CM | POA: Insufficient documentation

## 2012-09-24 DIAGNOSIS — I10 Essential (primary) hypertension: Secondary | ICD-10-CM | POA: Insufficient documentation

## 2012-09-24 DIAGNOSIS — W010XXA Fall on same level from slipping, tripping and stumbling without subsequent striking against object, initial encounter: Secondary | ICD-10-CM | POA: Insufficient documentation

## 2012-09-24 DIAGNOSIS — W19XXXA Unspecified fall, initial encounter: Secondary | ICD-10-CM

## 2012-09-24 DIAGNOSIS — F0280 Dementia in other diseases classified elsewhere without behavioral disturbance: Secondary | ICD-10-CM | POA: Insufficient documentation

## 2012-09-24 DIAGNOSIS — Y9301 Activity, walking, marching and hiking: Secondary | ICD-10-CM | POA: Insufficient documentation

## 2012-09-24 DIAGNOSIS — Z87891 Personal history of nicotine dependence: Secondary | ICD-10-CM | POA: Insufficient documentation

## 2012-09-24 DIAGNOSIS — G309 Alzheimer's disease, unspecified: Secondary | ICD-10-CM | POA: Insufficient documentation

## 2012-09-24 DIAGNOSIS — S7000XA Contusion of unspecified hip, initial encounter: Secondary | ICD-10-CM

## 2012-09-24 DIAGNOSIS — I2510 Atherosclerotic heart disease of native coronary artery without angina pectoris: Secondary | ICD-10-CM | POA: Insufficient documentation

## 2012-09-24 DIAGNOSIS — Z8719 Personal history of other diseases of the digestive system: Secondary | ICD-10-CM | POA: Insufficient documentation

## 2012-09-24 DIAGNOSIS — Z951 Presence of aortocoronary bypass graft: Secondary | ICD-10-CM | POA: Insufficient documentation

## 2012-09-24 DIAGNOSIS — Y92009 Unspecified place in unspecified non-institutional (private) residence as the place of occurrence of the external cause: Secondary | ICD-10-CM | POA: Insufficient documentation

## 2012-09-24 DIAGNOSIS — Z96649 Presence of unspecified artificial hip joint: Secondary | ICD-10-CM | POA: Insufficient documentation

## 2012-09-24 DIAGNOSIS — Z7982 Long term (current) use of aspirin: Secondary | ICD-10-CM | POA: Insufficient documentation

## 2012-09-24 DIAGNOSIS — Z79899 Other long term (current) drug therapy: Secondary | ICD-10-CM | POA: Insufficient documentation

## 2012-09-24 MED ORDER — HYDROCODONE-ACETAMINOPHEN 5-500 MG PO TABS: 1.0000 | ORAL_TABLET | Freq: Four times a day (QID) | ORAL | Status: AC | PRN

## 2012-09-24 NOTE — ED Notes (Signed)
Patient was walking in the den and slipped reaching for cane and fell, hitting R hip, states that pain has grown worse during the week, using a walker now to ambulate

## 2012-09-24 NOTE — ED Provider Notes (Signed)
History     CSN: [id number]  Arrival date & time 09/24/12  1106   First MD Initiated Contact with Patient 09/24/12 1148      Chief Complaint  Patient presents with  . Hip Pain  . Fall    (Consider location/radiation/quality/duration/timing/severity/associated sxs/prior treatment) HPI Comments: Patient with history of right total hip replacement.  Larey Seat one week ago and has been having discomfort since.  He has been able to get around and ambulate with the walker.    Patient is a 78 y.o. male presenting with hip pain. The history is provided by the patient.  Hip Pain This is a new problem. Episode onset: one week ago. The problem occurs constantly. The problem has not changed since onset.Pertinent negatives include no chest pain. Exacerbated by: movement, ambulation. Relieved by: rest. He has tried nothing for the symptoms.    Past Medical History  Diagnosis Date  . CAD (coronary artery disease)     2002 CABG  . HTN (hypertension)   . Hypercholesteremia   . C. difficile colitis   . Diverticulosis   . Alzheimer's disease MILD  . Dementia     Past Surgical History  Procedure Date  . Total hip arthroplasty   . Cholecystectomy   . Coronary artery bypass graft 11/2000     LIMA GRAFT TO LAD. SAPHENOUS VEIN GRAFT TO THE SECOND DIAGONAL, OM AND POSTERIOR LATERAL BRANCHES OF THE CIRCUMFLEX CORONARY  . Total hip arthroplasty     right side    Family History  Problem Relation Age of Onset  . Heart attack Father   . Coronary artery disease Father   . Cancer Brother   . Pneumonia Brother   . Other Brother     [employer]    History  Substance Use Topics  . Smoking status: Former Smoker    Types: Cigarettes    Quit date: 12/02/1983  . Smokeless tobacco: Not on file  . Alcohol Use: No      Review of Systems  Cardiovascular: Negative for chest pain.  All other systems reviewed and are negative.    Allergies  Aricept  Home Medications   Current Outpatient Rx  Name   Route  Sig  Dispense  Refill  . ASPIRIN 325 MG PO TABS   Oral   Take 325 mg by mouth daily.           Marland Kitchen CARVEDILOL 12.5 MG PO TABS   Oral   Take 6.25 mg by mouth 2 (two) times daily.           Marland Kitchen FLUOXETINE HCL 10 MG PO CAPS   Oral   Take 20 mg by mouth daily.           Marland Kitchen GALANTAMINE HYDROBROMIDE ER 16 MG PO CP24   Oral   Take 16 mg by mouth daily.           Marland Kitchen LACTOBACILLUS PO   Oral   Take 2 tablets by mouth 2 (two) times daily.           Marland Kitchen MAGNESIUM OXIDE 420 MG PO TABS   Oral   Take by mouth 2 (two) times daily.           . OMEGA 3 1000 MG PO CAPS   Oral   Take 2 capsules by mouth daily.           Marland Kitchen SIMVASTATIN 40 MG PO TABS   Oral   Take 20 mg by mouth daily.  BP 120/73  Pulse 60  Temp 98.7 F (37.1 C) (Oral)  Resp 18  Ht 5\' 8"  (1.727 m)  Wt 160 lb (72.576 kg)  BMI 24.33 kg/m2  SpO2 100%  Physical Exam  Nursing note and vitals reviewed. Constitutional: He is oriented to person, place, and time. He appears well-developed and well-nourished. No distress.  HENT:  Head: Normocephalic and atraumatic.  Mouth/Throat: Oropharynx is clear and moist.  Neck: Normal range of motion. Neck supple.  Abdominal: Soft. Bowel sounds are normal.  Musculoskeletal: Normal range of motion.       The right hip and proximal femur are ttp.  There is no pain with internal or external rotation.  The distal pulses, motor, and sensation are intact.  Neurological: He is alert and oriented to person, place, and time.  Skin: Skin is warm and dry. He is not diaphoretic.    ED Course  Procedures (including critical care time)  Labs Reviewed - No data to display No results found.   No diagnosis found.    MDM  No fractures on xrays.  Will discharge with pain meds.  If not improving in the next few days should follow up with his orthopedist.        Geoffery Lyons, MD 09/24/12 1326

## 2013-06-14 ENCOUNTER — Encounter (HOSPITAL_COMMUNITY): Payer: Self-pay

## 2013-06-14 ENCOUNTER — Ambulatory Visit (HOSPITAL_COMMUNITY)
Admission: RE | Admit: 2013-06-14 | Discharge: 2013-06-14 | Disposition: A | Discharge status: Home / Self Care | Payer: Medicare Other | Source: Ambulatory Visit | Attending: Internal Medicine | Admitting: Internal Medicine

## 2013-06-14 ENCOUNTER — Other Ambulatory Visit (HOSPITAL_COMMUNITY): Payer: Self-pay | Admitting: Internal Medicine

## 2013-06-14 DIAGNOSIS — M810 Age-related osteoporosis without current pathological fracture: Secondary | ICD-10-CM | POA: Insufficient documentation

## 2013-06-14 MED ORDER — ZOLEDRONIC ACID 5 MG/100ML IV SOLN
5.0000 mg | Freq: Once | INTRAVENOUS | Status: AC
  Administered 2013-06-14: 5 mg via INTRAVENOUS
  Filled 2013-06-14: qty 100

## 2013-06-14 MED ORDER — SODIUM CHLORIDE 0.9 % IV SOLN
Freq: Once | INTRAVENOUS | Status: AC
Start: 2013-06-14 — End: 2013-06-14
  Administered 2013-06-14: 13:00:00 via INTRAVENOUS

## 2013-08-15 ENCOUNTER — Ambulatory Visit (INDEPENDENT_AMBULATORY_CARE_PROVIDER_SITE_OTHER): Payer: Medicare Other | Admitting: Podiatry

## 2013-08-15 ENCOUNTER — Encounter: Payer: Self-pay | Admitting: Podiatry

## 2013-08-15 VITALS — BP 156/80 | HR 86 | Resp 16

## 2013-08-15 DIAGNOSIS — B351 Tinea unguium: Secondary | ICD-10-CM

## 2013-08-15 DIAGNOSIS — M79609 Pain in unspecified limb: Secondary | ICD-10-CM

## 2013-08-15 NOTE — Patient Instructions (Signed)
Apply topical antibiotic ointment to the fourth left toe starting on November 20 daily until healed.

## 2013-08-15 NOTE — Progress Notes (Signed)
Patient ID: Steven Yang, male   DOB: Nov 10, 1921, 79 y.o.   MRN: [id number] Subjective: Pleasant confused 79 year old white male presents with some complaining of painful toenails. He is a patient is fractures since 2013 and presents at approximately three-month intervals for nail debridement.  Objective: Brittle, brown, incurvated toenails x10 with palpable tenderness in all nail plates.  Assessment: Symptomatic onychomycoses x10  Plan: All 10 toenails are debrided without any bleeding. Reappoint at three-month intervals.

## 2013-11-07 ENCOUNTER — Ambulatory Visit: Payer: Medicare Other | Admitting: Podiatry

## 2013-11-21 ENCOUNTER — Ambulatory Visit: Payer: Medicare Other | Admitting: Podiatry

## 2013-12-05 ENCOUNTER — Encounter: Payer: Self-pay | Admitting: Podiatry

## 2013-12-05 ENCOUNTER — Ambulatory Visit (INDEPENDENT_AMBULATORY_CARE_PROVIDER_SITE_OTHER): Payer: Medicare HMO | Admitting: Podiatry

## 2013-12-05 VITALS — BP 149/67 | HR 78 | Resp 12

## 2013-12-05 DIAGNOSIS — M79609 Pain in unspecified limb: Secondary | ICD-10-CM

## 2013-12-05 DIAGNOSIS — B351 Tinea unguium: Secondary | ICD-10-CM

## 2013-12-06 NOTE — Progress Notes (Signed)
Patient ID: Steven Yang, male   DOB: 05-12-1922, 80 y.o.   MRN: [id number]  Subjective: His son is present with him today. A pleasant confused 80 year old white male presents with some complaining of painful toenails. He is a patient in this practice since 2013 and presents at approximately three-month intervals for nail debridement.   Objective: Brittle, brown, incurvated toenails x10 with palpable tenderness in all nail plates.  Assessment: Symptomatic onychomycoses x10   Plan: All 10 toenails are debrided without any bleeding. Reappoint at three-month intervals.

## 2014-03-06 ENCOUNTER — Ambulatory Visit (INDEPENDENT_AMBULATORY_CARE_PROVIDER_SITE_OTHER): Payer: Medicare HMO | Admitting: Podiatry

## 2014-03-06 ENCOUNTER — Encounter: Payer: Self-pay | Admitting: Podiatry

## 2014-03-06 VITALS — BP 130/85 | HR 62 | Resp 17 | Ht 67.0 in | Wt 185.0 lb

## 2014-03-06 DIAGNOSIS — M79609 Pain in unspecified limb: Secondary | ICD-10-CM

## 2014-03-06 DIAGNOSIS — B351 Tinea unguium: Secondary | ICD-10-CM

## 2014-03-06 NOTE — Patient Instructions (Signed)
Removed Band-Aid on fourth right toe in 24 hours and apply topical antibiotic ointment to the toe daily until healed

## 2014-03-07 NOTE — Progress Notes (Signed)
Patient ID: Steven Yang, male   DOB: [birth year], 80 y.o.   MRN: [id number]  Subjective: 80 year old white male presents with son complaining of uncomfortable toenails.  Objective: Brittle, incurvated, discolored, hypertrophic toenails x10 Dry a linear areas of excoriation noted on the left hallux area from associated itch/scratch a patient  Assessment: Symptomatic onychomycoses x10 Noninfected skin lesions as mentioned above  Plan: Nails x10 are debrided  right bleeding on the fourth right toe (antibiotic dressing applied)  Advise patient's son to apply topical antibiotic to the fourth right toe daily until healed Reappoint x3 months

## 2014-06-12 ENCOUNTER — Ambulatory Visit: Payer: Medicare HMO | Admitting: Podiatry

## 2014-06-19 ENCOUNTER — Ambulatory Visit (INDEPENDENT_AMBULATORY_CARE_PROVIDER_SITE_OTHER): Payer: Medicare HMO | Admitting: Podiatry

## 2014-06-19 DIAGNOSIS — B351 Tinea unguium: Secondary | ICD-10-CM

## 2014-06-19 DIAGNOSIS — M79609 Pain in unspecified limb: Secondary | ICD-10-CM

## 2014-06-19 DIAGNOSIS — M79676 Pain in unspecified toe(s): Secondary | ICD-10-CM

## 2014-06-20 NOTE — Progress Notes (Signed)
Patient ID: Steven Yang, male   DOB: [birth year], 80 y.o.   MRN: [id number]  Subjective: This patient presents with his son and is complaining of uncomfortable toenails  Objective: The toenails are elongated, brittle, hypertrophic, discolored 6-10  Assessment: Symptomatic onychomycoses x10  Plan: Nails x10 are debrided without a bleeding  Reappoint x3 months

## 2014-07-25 ENCOUNTER — Encounter (HOSPITAL_COMMUNITY): Payer: Self-pay

## 2014-07-25 ENCOUNTER — Other Ambulatory Visit (HOSPITAL_COMMUNITY): Payer: Self-pay | Admitting: Internal Medicine

## 2014-07-25 ENCOUNTER — Ambulatory Visit (HOSPITAL_COMMUNITY)
Admission: RE | Admit: 2014-07-25 | Discharge: 2014-07-25 | Disposition: A | Discharge status: Home / Self Care | Payer: Medicare HMO | Source: Ambulatory Visit | Attending: Internal Medicine | Admitting: Internal Medicine

## 2014-07-25 DIAGNOSIS — M810 Age-related osteoporosis without current pathological fracture: Secondary | ICD-10-CM | POA: Insufficient documentation

## 2014-07-25 MED ORDER — SODIUM CHLORIDE 0.9 % IV SOLN
Freq: Once | INTRAVENOUS | Status: AC
  Administered 2014-07-25: 11:00:00 via INTRAVENOUS

## 2014-07-25 MED ORDER — ZOLEDRONIC ACID 5 MG/100ML IV SOLN
5.0000 mg | Freq: Once | INTRAVENOUS | Status: AC
  Administered 2014-07-25: 5 mg via INTRAVENOUS
  Filled 2014-07-25: qty 100

## 2014-07-25 NOTE — Discharge Instructions (Signed)

## 2014-07-25 NOTE — Progress Notes (Signed)
Pt takes Vitamin D, but not Calcium.  Informed pt and his family to just double check with MD about taking Calcium supplement.  Usually with Reclast pt's take both, informed the family of this.  But informed pt and his family to inquire with his doctor if he wants him to take Calcium, because some doctors don't require it.  Pt and family voiced understanding.

## 2014-09-11 ENCOUNTER — Ambulatory Visit: Payer: Medicare HMO | Admitting: Podiatry

## 2014-10-16 ENCOUNTER — Ambulatory Visit (INDEPENDENT_AMBULATORY_CARE_PROVIDER_SITE_OTHER): Payer: Medicare HMO | Admitting: Podiatry

## 2014-10-16 DIAGNOSIS — M79676 Pain in unspecified toe(s): Secondary | ICD-10-CM

## 2014-10-16 DIAGNOSIS — B351 Tinea unguium: Secondary | ICD-10-CM

## 2014-10-16 NOTE — Progress Notes (Signed)
Patient ID: Steven Yang, male   DOB: [birth year], 81 y.o.   MRN: [id number]  Subjective: This patient presents with his son who is requesting debridement of painful mycotic toenails  Objective: Non-orientated 3 The toenails are elongated, hypertrophic, incurvated, brittle, discolored and tender to palpation 6-10  Assessment: Symptomatic onychomycoses 6-10  Plan: Debrided toenails 10 with slight pinpoint bleeding distal first right toe  Apply topical antibiotic ointment and Band-Aid and instructed son to continue Applying Antibiotic Ointment and Band-Aid daily until healed  Reappoint at three-month intervals

## 2015-01-15 ENCOUNTER — Ambulatory Visit: Payer: Medicare HMO | Admitting: Podiatry

## 2015-01-22 ENCOUNTER — Ambulatory Visit (INDEPENDENT_AMBULATORY_CARE_PROVIDER_SITE_OTHER): Payer: Medicare HMO | Admitting: Cardiology

## 2015-01-22 ENCOUNTER — Encounter: Payer: Self-pay | Admitting: Cardiology

## 2015-01-22 VITALS — BP 110/62 | HR 72 | Ht 68.0 in | Wt 193.1 lb

## 2015-01-22 DIAGNOSIS — E7800 Pure hypercholesterolemia, unspecified: Secondary | ICD-10-CM

## 2015-01-22 DIAGNOSIS — I10 Essential (primary) hypertension: Secondary | ICD-10-CM | POA: Diagnosis not present

## 2015-01-22 DIAGNOSIS — I2510 Atherosclerotic heart disease of native coronary artery without angina pectoris: Secondary | ICD-10-CM | POA: Diagnosis not present

## 2015-01-22 NOTE — Progress Notes (Signed)
Steven Yang Date of Birth: 06-15-1922 Medical Record #[id number]  History of Present Illness: Steven Yang is seen for follow up CAD. Last seen 2 years ago. He is now 81 yo. He is status post CABG in 2002. He had a nuclear stress test in 2008 which was normal. He has had no significant anginal symptoms. He does have dementia. His family reports that he is very inactive and sleeps a lot. He does get fatigued and SOB easily with activity. Labs followed by primary care apparently OK.  Current Outpatient Prescriptions on File Prior to Visit  Medication Sig Dispense Refill  . aspirin 325 MG tablet Take 325 mg by mouth daily.      . carvedilol (COREG) 12.5 MG tablet Take 6.25 mg by mouth 2 (two) times daily.      . cyanocobalamin 1000 MCG tablet Take 1,000 mcg by mouth daily.    Marland Kitchen FLUoxetine (PROZAC) 10 MG capsule Take 20 mg by mouth daily.      Marland Kitchen galantamine (RAZADYNE ER) 16 MG 24 hr capsule Take 16 mg by mouth daily.      Marland Kitchen HYDROcodone-acetaminophen (VICODIN) 5-500 MG per tablet Take 1 tablet by mouth at bedtime.    Marland Kitchen LACTOBACILLUS PO Take 2 tablets by mouth 2 (two) times daily.      . Magnesium Oxide 420 MG TABS Take by mouth 2 (two) times daily.      . Memantine HCl ER (NAMENDA XR) 28 MG CP24 Take by mouth daily.    . OMEGA 3 1000 MG CAPS Take 2 capsules by mouth daily.      . simvastatin (ZOCOR) 40 MG tablet Take 20 mg by mouth daily.      . Vitamin D, Ergocalciferol, (DRISDOL) 50000 UNITS CAPS capsule Take 50,000 Units by mouth 3 (three) times a week.    . zoledronic acid (RECLAST) 5 MG/100ML SOLN injection Inject 5 mg into the vein once.     No current facility-administered medications on file prior to visit.    Allergies  Allergen Reactions  . Aricept [Donepezil Hydrochloride]     Past Medical History  Diagnosis Date  . CAD (coronary artery disease)     2002 CABG  . HTN (hypertension)   . Hypercholesteremia   . C. difficile colitis   . Diverticulosis   . Alzheimer's  disease MILD  . Dementia     Past Surgical History  Procedure Laterality Date  . Total hip arthroplasty    . Cholecystectomy    . Coronary artery bypass graft  11/2000     LIMA GRAFT TO LAD. SAPHENOUS VEIN GRAFT TO THE SECOND DIAGONAL, OM AND POSTERIOR LATERAL BRANCHES OF THE CIRCUMFLEX CORONARY  . Total hip arthroplasty      right side    History  Smoking status  . Former Smoker  . Types: Cigarettes  . Quit date: 12/02/1983  Smokeless tobacco  . Not on file    History  Alcohol Use No    Family History  Problem Relation Age of Onset  . Heart attack Father   . Coronary artery disease Father   . Cancer Brother   . Pneumonia Brother   . Other Brother     [employer]    Review of Systems: The review of systems is positive for sedentary lifestyle.  All other systems were reviewed and are negative.  Physical Exam: BP 110/62 mmHg  Pulse 72  Ht  (1.727 m)  Wt 193 lb 1.6 oz (87.59 kg)  BMI 29.37 kg/m2 He is an elderly white male in no acute distress. HEENT exam is unremarkable. He has no JVD or bruits. Lungs are clear. Cardiac exam reveals a regular rate and rhythm without gallop, murmur, or click. Abdomen is soft and nontender without organomegaly. Extremities are without edema. Pedal pulses are good. He is alert but his short-term memory is poor.  LABORATORY DATA:  Assessment / Plan:  1. CAD s/p remote CABG. No clear anginal symptoms. Fatigues easily but is very deconditioned. Will continue ASA, beta blocker and statin. 2. HTN controlled. 3. Hyperlipidemia. Labs followed by Dr. Waynard EdwardsPerini. 4. Dementia.

## 2015-01-22 NOTE — Patient Instructions (Signed)
Continue your current therapy  I will see you in one year   

## 2015-02-12 ENCOUNTER — Ambulatory Visit: Payer: Medicare HMO | Admitting: Podiatry

## 2015-03-12 ENCOUNTER — Ambulatory Visit (INDEPENDENT_AMBULATORY_CARE_PROVIDER_SITE_OTHER): Payer: Medicare HMO | Admitting: Podiatry

## 2015-03-12 ENCOUNTER — Encounter: Payer: Self-pay | Admitting: Podiatry

## 2015-03-12 DIAGNOSIS — B351 Tinea unguium: Secondary | ICD-10-CM | POA: Diagnosis not present

## 2015-03-12 DIAGNOSIS — M79676 Pain in unspecified toe(s): Secondary | ICD-10-CM

## 2015-03-13 NOTE — Progress Notes (Signed)
Patient ID: Steven Yang, male   DOB: 1922/02/12, 81 y.o.   MRN: [id number]  Subjective: This confused patient presents with son who is in the treatment room who was requesting debridement of his father's painful toenails  Objective: Confused patient nonresponsive The toenails are elongated, hypertrophic, discolored, incurvated and tender to direct palpation 6-10  Assessment: Symptomatic onychomycoses 6-10 Diffuse patient associated with dementia  Plan: Debridement of toenails 10 without any bleeding  Reappoint 3 months

## 2015-06-11 ENCOUNTER — Ambulatory Visit (INDEPENDENT_AMBULATORY_CARE_PROVIDER_SITE_OTHER): Payer: Medicare HMO | Admitting: Podiatry

## 2015-06-11 ENCOUNTER — Encounter: Payer: Self-pay | Admitting: Podiatry

## 2015-06-11 DIAGNOSIS — M79676 Pain in unspecified toe(s): Secondary | ICD-10-CM

## 2015-06-11 DIAGNOSIS — B351 Tinea unguium: Secondary | ICD-10-CM

## 2015-06-12 NOTE — Progress Notes (Signed)
Patient ID: Steven Yang, male   DOB: [birth year], 80 y.o.   MRN: [id number]  Subjective: This confused patient presents with his son who is in the treatment room was requesting debridement of his father's toenails. He has a history of uncomfortable toenails and has repetitive debridement  Objective: Confused patient The toenails are elongated, discolored, incurvated, hypertrophic and tender to direct palpation  Assessment: Symptomatic onychomycoses 6-10  Plan: Debridement toenails 10 mechanically and electrically without a bleeding  Reappoint 3

## 2015-06-22 ENCOUNTER — Emergency Department (HOSPITAL_COMMUNITY)
Admission: EM | Admit: 2015-06-22 | Discharge: 2015-06-22 | Disposition: A | Discharge status: Home / Self Care | Payer: Medicare HMO | Attending: Emergency Medicine | Admitting: Emergency Medicine

## 2015-06-22 ENCOUNTER — Encounter (HOSPITAL_COMMUNITY): Payer: Self-pay | Admitting: Emergency Medicine

## 2015-06-22 DIAGNOSIS — Z87891 Personal history of nicotine dependence: Secondary | ICD-10-CM | POA: Diagnosis not present

## 2015-06-22 DIAGNOSIS — E780 Pure hypercholesterolemia: Secondary | ICD-10-CM | POA: Insufficient documentation

## 2015-06-22 DIAGNOSIS — I2510 Atherosclerotic heart disease of native coronary artery without angina pectoris: Secondary | ICD-10-CM | POA: Diagnosis not present

## 2015-06-22 DIAGNOSIS — K5900 Constipation, unspecified: Secondary | ICD-10-CM | POA: Diagnosis present

## 2015-06-22 DIAGNOSIS — Z7982 Long term (current) use of aspirin: Secondary | ICD-10-CM | POA: Insufficient documentation

## 2015-06-22 DIAGNOSIS — Z951 Presence of aortocoronary bypass graft: Secondary | ICD-10-CM | POA: Diagnosis not present

## 2015-06-22 DIAGNOSIS — G308 Other Alzheimer's disease: Secondary | ICD-10-CM | POA: Diagnosis not present

## 2015-06-22 DIAGNOSIS — I10 Essential (primary) hypertension: Secondary | ICD-10-CM | POA: Diagnosis not present

## 2015-06-22 DIAGNOSIS — Z8619 Personal history of other infectious and parasitic diseases: Secondary | ICD-10-CM | POA: Diagnosis not present

## 2015-06-22 DIAGNOSIS — F0280 Dementia in other diseases classified elsewhere without behavioral disturbance: Secondary | ICD-10-CM | POA: Diagnosis not present

## 2015-06-22 DIAGNOSIS — Z79899 Other long term (current) drug therapy: Secondary | ICD-10-CM | POA: Diagnosis not present

## 2015-06-22 MED ORDER — MILK AND MOLASSES ENEMA
1.0000 | Freq: Once | RECTAL | Status: DC
  Filled 2015-06-22: qty 250

## 2015-06-22 MED ORDER — POLYETHYLENE GLYCOL 3350 17 G PO PACK: 17.0000 g | PACK | Freq: Every day | ORAL | Status: DC

## 2015-06-22 NOTE — Discharge Instructions (Signed)
Return to the ED with any concerns including vomiting, abdominal pain, decreased level of alertness/lethargy, or any other alarming symptoms 

## 2015-06-22 NOTE — ED Notes (Signed)
While waiting on Molasses from pharmacy, patient requested to use the restroom. He was assisted to the restroom by EMT, Tiffany and patient had a "large, formed, brown stool" (no visible blood).  Dr. Karma Ganja was notified.  Milk and molasses enema discontinued.

## 2015-06-22 NOTE — ED Notes (Signed)
Bed: OZ36 Expected date: 06/22/15 Expected time: 9:49 AM Means of arrival: Ambulance Comments: Constipation

## 2015-06-22 NOTE — ED Provider Notes (Signed)
CSN: [id number]     Arrival date & time 06/22/15  4098 History   First MD Initiated Contact with Patient 06/22/15 [phone]     Chief Complaint  Patient presents with  . Constipation     (Consider location/radiation/quality/duration/timing/severity/associated sxs/prior Treatment) HPI  A LEVEL 5 CAVEAT PERTAINS DUE TO DEMENTIA Pt presenting with c/o constipation.  Has not had a bowel movement in 3 days.  He has no abdominal pain.  No vomiting.  Family has tried milk of magnesia, prune juice, suppository without relief.  No other symptoms.    Past Medical History  Diagnosis Date  . CAD (coronary artery disease)     2002 CABG  . HTN (hypertension)   . Hypercholesteremia   . C. difficile colitis   . Diverticulosis   . Alzheimer's disease MILD  . Dementia    Past Surgical History  Procedure Laterality Date  . Total hip arthroplasty    . Cholecystectomy    . Coronary artery bypass graft  11/2000     LIMA GRAFT TO LAD. SAPHENOUS VEIN GRAFT TO THE SECOND DIAGONAL, OM AND POSTERIOR LATERAL BRANCHES OF THE CIRCUMFLEX CORONARY  . Total hip arthroplasty      right side   Family History  Problem Relation Age of Onset  . Heart attack Father   . Coronary artery disease Father   . Cancer Brother   . Pneumonia Brother   . Other Brother     [employer]   Social History  Substance Use Topics  . Smoking status: Former Smoker    Types: Cigarettes    Quit date: 12/02/1983  . Smokeless tobacco: None  . Alcohol Use: No    Review of Systems  UNABLE TO OBTAIN ROS DUE TO LEVEL 5 CAVEAT    Allergies  Aricept  Home Medications   Prior to Admission medications   Medication Sig Start Date End Date Taking? Authorizing Aadhya Bustamante  aspirin 325 MG tablet Take 325 mg by mouth daily.   Yes Historical Sebastain Fishbaugh, MD  carvedilol (COREG) 12.5 MG tablet Take 6.25 mg by mouth 2 (two) times daily.     Yes Historical Pascha Fogal, MD  cyanocobalamin 1000 MCG tablet Take 1,000 mcg by mouth daily.   Yes Historical  Trent Gabler, MD  FLUoxetine (PROZAC) 20 MG capsule Take 40 mg by mouth daily.   Yes Historical Jaeleen Inzunza, MD  galantamine (RAZADYNE ER) 16 MG 24 hr capsule Take 16 mg by mouth daily.     Yes Historical Gavriella Hearst, MD  guaiFENesin (MUCINEX CHEST CONGESTION CHILD) 100 MG/5ML liquid Take 200 mg by mouth 3 (three) times daily as needed for cough.   Yes Historical Hanny Elsberry, MD  HYDROcodone-acetaminophen (VICODIN) 5-500 MG per tablet Take 1 tablet by mouth 3 times/day as needed-between meals & bedtime for pain.    Yes Historical Martinez Boxx, MD  LACTOBACILLUS PO Take 2 tablets by mouth 2 (two) times daily.     Yes Historical Aylin Rhoads, MD  Magnesium Hydroxide (MILK OF MAGNESIA CONCENTRATE PO) Take 30 mg by mouth daily as needed (constipation).   Yes Historical Shakelia Scrivner, MD  memantine (NAMENDA) 10 MG tablet Take 10 mg by mouth 2 (two) times daily.   Yes Historical Kashia Brossard, MD  OMEGA 3 1000 MG CAPS Take 2 capsules by mouth daily.     Yes Historical Lea Walbert, MD  senna-docusate (SENOKOT-S) 8.6-50 MG per tablet Take 2 tablets by mouth once.   Yes Historical Jackline Castilla, MD  simvastatin (ZOCOR) 80 MG tablet Take 40 mg by mouth daily.  Yes Historical Markeshia Giebel, MD  Vitamin D, Ergocalciferol, (DRISDOL) 50000 UNITS CAPS capsule Take 50,000 Units by mouth every 7 (seven) days. Wednesday   Yes Historical Jennette Leask, MD  polyethylene glycol (MIRALAX) packet Take 17 g by mouth daily. 06/22/15   Jerelyn Scott, MD   BP 118/58 mmHg  Pulse 72  Temp(Src) 97.9 F (36.6 C) (Oral)  Resp 20  SpO2 94%  Vitals reviewed Physical Exam  Physical Examination: General appearance - alert, well appearing, and in no distress Mental status - alert, oriented to person, place, and time Eyes - no conjunctival injection, no scleral icterus Chest - clear to auscultation, no wheezes, rales or rhonchi, symmetric air entry Heart - normal rate, regular rhythm, normal S1, S2, no murmurs, rubs, clicks or gallops Abdomen - soft, nontender, nondistended,  no masses or organomegaly, nabs Neurological - alert, oriented x 2, normal speech Extremities - peripheral pulses normal, no pedal edema, no clubbing or cyanosis Skin - normal coloration and turgor, no rashes  ED Course  Procedures (including critical care time) Labs Review Labs Reviewed - No data to display  Imaging Review No results found. I have personally reviewed and evaluated these images and lab results as part of my medical decision-making.   EKG Interpretation None      MDM   Final diagnoses:  Constipation, unspecified constipation type     Abdominal exam is benign, no tenderness or vomiting to suggest obstruction.    11:31 AM pt has had output of a large amount of stool without the aid of enema.  Will discharge and recommend starting on miralax for maintenance.   Jerelyn Scott, MD 06/22/15 [phone]

## 2015-06-22 NOTE — ED Notes (Signed)
Family reports pt is constipated x 3 days. Pt with no complaints of pain, denies n/v.

## 2015-09-17 ENCOUNTER — Ambulatory Visit: Payer: Medicare HMO | Admitting: Podiatry

## 2015-10-15 ENCOUNTER — Ambulatory Visit (INDEPENDENT_AMBULATORY_CARE_PROVIDER_SITE_OTHER): Payer: Medicare HMO | Admitting: Podiatry

## 2015-10-15 ENCOUNTER — Encounter: Payer: Self-pay | Admitting: Podiatry

## 2015-10-15 DIAGNOSIS — B351 Tinea unguium: Secondary | ICD-10-CM | POA: Diagnosis not present

## 2015-10-15 DIAGNOSIS — M79676 Pain in unspecified toe(s): Secondary | ICD-10-CM | POA: Diagnosis not present

## 2015-10-15 NOTE — Progress Notes (Signed)
Patient ID: Steven Yang, male   DOB: 1922/07/14, 82 y.o.   MRN: [id number]  Subjective: This confused patient presents with his son in the treatment room. The son is requesting debridement of his father's toenails who he says is complaining of uncomfortable toenails when he wears shoes and walks  Objective: Pleasant but confused patient The toenails are hypertrophic, incurvated, brittle, deformed and tender to direct palpation 6-10  Assessment: Symptomatic onychomycoses 6-10  Plan: Debridement toenails 6-10 mechanically an electrical without any bleeding  Reappoint 3 month

## 2016-01-21 ENCOUNTER — Ambulatory Visit: Payer: Medicare HMO | Admitting: Podiatry

## 2016-02-28 ENCOUNTER — Emergency Department (HOSPITAL_COMMUNITY): Payer: Non-veteran care

## 2016-02-28 ENCOUNTER — Inpatient Hospital Stay (HOSPITAL_COMMUNITY)
Admission: EM | Admit: 2016-02-28 | Discharge: 2016-03-04 | DRG: 871 | Disposition: A | Discharge status: Skilled Nursing Facility | Payer: Non-veteran care | Attending: Internal Medicine | Admitting: Internal Medicine

## 2016-02-28 ENCOUNTER — Encounter (HOSPITAL_COMMUNITY): Payer: Self-pay

## 2016-02-28 ENCOUNTER — Inpatient Hospital Stay (HOSPITAL_COMMUNITY): Payer: Non-veteran care

## 2016-02-28 ENCOUNTER — Inpatient Hospital Stay (HOSPITAL_COMMUNITY): Admit: 2016-02-28 | Payer: Non-veteran care

## 2016-02-28 DIAGNOSIS — J189 Pneumonia, unspecified organism: Secondary | ICD-10-CM | POA: Diagnosis present

## 2016-02-28 DIAGNOSIS — I4892 Unspecified atrial flutter: Secondary | ICD-10-CM | POA: Diagnosis present

## 2016-02-28 DIAGNOSIS — G309 Alzheimer's disease, unspecified: Secondary | ICD-10-CM | POA: Diagnosis present

## 2016-02-28 DIAGNOSIS — J9600 Acute respiratory failure, unspecified whether with hypoxia or hypercapnia: Secondary | ICD-10-CM | POA: Diagnosis present

## 2016-02-28 DIAGNOSIS — J9601 Acute respiratory failure with hypoxia: Secondary | ICD-10-CM

## 2016-02-28 DIAGNOSIS — Z951 Presence of aortocoronary bypass graft: Secondary | ICD-10-CM | POA: Diagnosis not present

## 2016-02-28 DIAGNOSIS — R7989 Other specified abnormal findings of blood chemistry: Secondary | ICD-10-CM | POA: Insufficient documentation

## 2016-02-28 DIAGNOSIS — I5031 Acute diastolic (congestive) heart failure: Secondary | ICD-10-CM | POA: Diagnosis present

## 2016-02-28 DIAGNOSIS — G4700 Insomnia, unspecified: Secondary | ICD-10-CM | POA: Diagnosis present

## 2016-02-28 DIAGNOSIS — E785 Hyperlipidemia, unspecified: Secondary | ICD-10-CM | POA: Diagnosis present

## 2016-02-28 DIAGNOSIS — R0902 Hypoxemia: Secondary | ICD-10-CM | POA: Diagnosis not present

## 2016-02-28 DIAGNOSIS — R791 Abnormal coagulation profile: Secondary | ICD-10-CM

## 2016-02-28 DIAGNOSIS — I447 Left bundle-branch block, unspecified: Secondary | ICD-10-CM

## 2016-02-28 DIAGNOSIS — I25119 Atherosclerotic heart disease of native coronary artery with unspecified angina pectoris: Secondary | ICD-10-CM | POA: Diagnosis present

## 2016-02-28 DIAGNOSIS — Z96641 Presence of right artificial hip joint: Secondary | ICD-10-CM | POA: Diagnosis present

## 2016-02-28 DIAGNOSIS — J9690 Respiratory failure, unspecified, unspecified whether with hypoxia or hypercapnia: Secondary | ICD-10-CM

## 2016-02-28 DIAGNOSIS — K7689 Other specified diseases of liver: Secondary | ICD-10-CM | POA: Diagnosis present

## 2016-02-28 DIAGNOSIS — I509 Heart failure, unspecified: Secondary | ICD-10-CM

## 2016-02-28 DIAGNOSIS — A419 Sepsis, unspecified organism: Principal | ICD-10-CM | POA: Diagnosis present

## 2016-02-28 DIAGNOSIS — E7800 Pure hypercholesterolemia, unspecified: Secondary | ICD-10-CM | POA: Diagnosis present

## 2016-02-28 DIAGNOSIS — I483 Typical atrial flutter: Secondary | ICD-10-CM | POA: Diagnosis not present

## 2016-02-28 DIAGNOSIS — Z8249 Family history of ischemic heart disease and other diseases of the circulatory system: Secondary | ICD-10-CM | POA: Diagnosis not present

## 2016-02-28 DIAGNOSIS — Z7982 Long term (current) use of aspirin: Secondary | ICD-10-CM | POA: Diagnosis not present

## 2016-02-28 DIAGNOSIS — Z87891 Personal history of nicotine dependence: Secondary | ICD-10-CM | POA: Diagnosis not present

## 2016-02-28 DIAGNOSIS — Z888 Allergy status to other drugs, medicaments and biological substances status: Secondary | ICD-10-CM | POA: Diagnosis not present

## 2016-02-28 DIAGNOSIS — R5381 Other malaise: Secondary | ICD-10-CM | POA: Insufficient documentation

## 2016-02-28 DIAGNOSIS — I110 Hypertensive heart disease with heart failure: Secondary | ICD-10-CM | POA: Diagnosis present

## 2016-02-28 DIAGNOSIS — R0600 Dyspnea, unspecified: Secondary | ICD-10-CM | POA: Diagnosis not present

## 2016-02-28 DIAGNOSIS — E876 Hypokalemia: Secondary | ICD-10-CM | POA: Diagnosis present

## 2016-02-28 DIAGNOSIS — R627 Adult failure to thrive: Secondary | ICD-10-CM | POA: Diagnosis present

## 2016-02-28 DIAGNOSIS — E872 Acidosis: Secondary | ICD-10-CM | POA: Diagnosis present

## 2016-02-28 DIAGNOSIS — Z79899 Other long term (current) drug therapy: Secondary | ICD-10-CM

## 2016-02-28 DIAGNOSIS — R0603 Acute respiratory distress: Secondary | ICD-10-CM

## 2016-02-28 DIAGNOSIS — I4891 Unspecified atrial fibrillation: Secondary | ICD-10-CM | POA: Diagnosis present

## 2016-02-28 LAB — COMPREHENSIVE METABOLIC PANEL
ALT: 22 U/L (ref 17–63)
ANION GAP: 9 (ref 5–15)
AST: 39 U/L (ref 15–41)
Albumin: 3.4 g/dL — ABNORMAL LOW (ref 3.5–5.0)
Alkaline Phosphatase: 64 U/L (ref 38–126)
BILIRUBIN TOTAL: 2.3 mg/dL — AB (ref 0.3–1.2)
BUN: 10 mg/dL (ref 6–20)
CO2: 23 mmol/L (ref 22–32)
CREATININE: 0.97 mg/dL (ref 0.61–1.24)
Calcium: 9.2 mg/dL (ref 8.9–10.3)
Chloride: 106 mmol/L (ref 101–111)
GFR calc Af Amer: >60 mL/min (ref >60)
GFR calc non Af Amer: >60 mL/min (ref >60)
Glucose, Bld: 153 mg/dL — ABNORMAL HIGH (ref 65–99)
POTASSIUM: 3.7 mmol/L (ref 3.5–5.1)
SODIUM: 138 mmol/L (ref 135–145)
Total Protein: 6.5 g/dL (ref 6.5–8.1)

## 2016-02-28 LAB — CBC WITH DIFFERENTIAL/PLATELET
Basophils Absolute: 0 10*3/uL (ref 0.0–0.1)
Basophils Relative: 0 %
EOS ABS: 0.1 10*3/uL (ref 0.0–0.7)
Eosinophils Relative: 1 %
HCT: 38.9 % — ABNORMAL LOW (ref 39.0–52.0)
HEMOGLOBIN: 12.8 g/dL — AB (ref 13.0–17.0)
LYMPHS ABS: 1 10*3/uL (ref 0.7–4.0)
Lymphocytes Relative: 6 %
MCH: 31 pg (ref 26.0–34.0)
MCHC: 32.9 g/dL (ref 30.0–36.0)
MCV: 94.2 fL (ref 78.0–100.0)
MONO ABS: 0.9 10*3/uL (ref 0.1–1.0)
MONOS PCT: 6 %
NEUTROS PCT: 87 %
Neutro Abs: 13.2 10*3/uL — ABNORMAL HIGH (ref 1.7–7.7)
Platelets: 206 10*3/uL (ref 150–400)
RBC: 4.13 MIL/uL — ABNORMAL LOW (ref 4.22–5.81)
RDW: 14.1 % (ref 11.5–15.5)
WBC: 15.2 10*3/uL — ABNORMAL HIGH (ref 4.0–10.5)

## 2016-02-28 LAB — I-STAT TROPONIN, ED
TROPONIN I, POC: 0.05 ng/mL (ref 0.00–0.08)
Troponin i, poc: 0.05 ng/mL (ref 0.00–0.08)

## 2016-02-28 LAB — I-STAT CG4 LACTIC ACID, ED
Lactic Acid, Venous: 2.36 mmol/L (ref 0.5–2.0)
Lactic Acid, Venous: 2.94 mmol/L (ref 0.5–2.0)

## 2016-02-28 LAB — I-STAT ARTERIAL BLOOD GAS, ED
ACID-BASE DEFICIT: 5 mmol/L — AB (ref 0.0–2.0)
Acid-base deficit: 2 mmol/L (ref 0.0–2.0)
BICARBONATE: 18.9 meq/L — AB (ref 20.0–24.0)
Bicarbonate: 23.3 mEq/L (ref 20.0–24.0)
O2 SAT: 94 %
O2 Saturation: 98 %
PCO2 ART: 39.4 mmHg (ref 35.0–45.0)
PH ART: 7.379 (ref 7.350–7.450)
Patient temperature: 98.6
TCO2: 24 mmol/L (ref 0–100)
TCO2: 20 mmol/L (ref 0–100)
pCO2 arterial: 32.6 mmHg — ABNORMAL LOW (ref 35.0–45.0)
pH, Arterial: 7.371 (ref 7.350–7.450)
pO2, Arterial: 109 mmHg — ABNORMAL HIGH (ref 80.0–100.0)
pO2, Arterial: 70 mmHg — ABNORMAL LOW (ref 80.0–100.0)

## 2016-02-28 LAB — MRSA PCR SCREENING: MRSA by PCR: NEGATIVE

## 2016-02-28 LAB — URINE MICROSCOPIC-ADD ON

## 2016-02-28 LAB — URINALYSIS, ROUTINE W REFLEX MICROSCOPIC
Bilirubin Urine: NEGATIVE
GLUCOSE, UA: 100 mg/dL — AB
Ketones, ur: 15 mg/dL — AB
LEUKOCYTES UA: NEGATIVE
Nitrite: NEGATIVE
PH: 5.5 (ref 5.0–8.0)
Protein, ur: 100 mg/dL — AB
SPECIFIC GRAVITY, URINE: 1.02 (ref 1.005–1.030)

## 2016-02-28 LAB — PROTIME-INR
INR: 1.08 (ref 0.00–1.49)
Prothrombin Time: 14.2 seconds (ref 11.6–15.2)

## 2016-02-28 LAB — LACTIC ACID, PLASMA
Lactic Acid, Venous: 1.8 mmol/L (ref 0.5–2.0)
Lactic Acid, Venous: 2 mmol/L (ref 0.5–2.0)

## 2016-02-28 LAB — PROCALCITONIN: Procalcitonin: 0.29 ng/mL

## 2016-02-28 LAB — STREP PNEUMONIAE URINARY ANTIGEN: STREP PNEUMO URINARY ANTIGEN: NEGATIVE

## 2016-02-28 LAB — BRAIN NATRIURETIC PEPTIDE: B Natriuretic Peptide: 121 pg/mL — ABNORMAL HIGH (ref 0.0–100.0)

## 2016-02-28 LAB — D-DIMER, QUANTITATIVE: D-Dimer, Quant: 1.98 ug/mL-FEU — ABNORMAL HIGH (ref 0.00–0.50)

## 2016-02-28 MED ORDER — MEMANTINE HCL 10 MG PO TABS
10.0000 mg | ORAL_TABLET | Freq: Two times a day (BID) | ORAL | Status: DC
  Administered 2016-02-28 – 2016-03-04 (×10): 10 mg via ORAL
  Filled 2016-02-28 (×11): qty 1

## 2016-02-28 MED ORDER — IOPAMIDOL (ISOVUE-370) INJECTION 76%
INTRAVENOUS | Status: AC
  Administered 2016-02-28: 100 mL
  Filled 2016-02-28: qty 100

## 2016-02-28 MED ORDER — IPRATROPIUM-ALBUTEROL 0.5-2.5 (3) MG/3ML IN SOLN
3.0000 mL | Freq: Four times a day (QID) | RESPIRATORY_TRACT | Status: DC
  Administered 2016-02-28 – 2016-03-01 (×7): 3 mL via RESPIRATORY_TRACT
  Filled 2016-02-28 (×7): qty 3

## 2016-02-28 MED ORDER — CETYLPYRIDINIUM CHLORIDE 0.05 % MT LIQD
7.0000 mL | Freq: Two times a day (BID) | OROMUCOSAL | Status: DC
Start: 2016-02-28 — End: 2016-03-01
  Administered 2016-02-28 – 2016-03-01 (×4): 7 mL via OROMUCOSAL

## 2016-02-28 MED ORDER — ENOXAPARIN SODIUM 40 MG/0.4ML ~~LOC~~ SOLN: 40.0000 mg | SUBCUTANEOUS | Status: DC

## 2016-02-28 MED ORDER — SODIUM CHLORIDE 0.9 % IV BOLUS (SEPSIS): 1000.0000 mL | Freq: Once | INTRAVENOUS | Status: DC

## 2016-02-28 MED ORDER — FLUOXETINE HCL 20 MG PO CAPS
40.0000 mg | ORAL_CAPSULE | Freq: Every day | ORAL | Status: DC
  Administered 2016-02-29 – 2016-03-04 (×5): 40 mg via ORAL
  Filled 2016-02-28 (×5): qty 2

## 2016-02-28 MED ORDER — DEXTROSE 5 % IV SOLN
1.0000 g | INTRAVENOUS | Status: DC
  Administered 2016-02-29 – 2016-03-01 (×2): 1 g via INTRAVENOUS
  Filled 2016-02-28 (×3): qty 10

## 2016-02-28 MED ORDER — SODIUM CHLORIDE 0.9 % IV SOLN
INTRAVENOUS | Status: DC
  Administered 2016-02-28: 75 mL/h via INTRAVENOUS
  Administered 2016-02-28: 19:00:00 via INTRAVENOUS

## 2016-02-28 MED ORDER — DEXTROSE 5 % IV SOLN
500.0000 mg | INTRAVENOUS | Status: DC
  Administered 2016-02-29 – 2016-03-01 (×2): 500 mg via INTRAVENOUS
  Filled 2016-02-28 (×3): qty 500

## 2016-02-28 MED ORDER — IOPAMIDOL (ISOVUE-370) INJECTION 76%
INTRAVENOUS | Status: AC
  Filled 2016-02-28: qty 100

## 2016-02-28 MED ORDER — DEXTROSE 5 % IV SOLN
500.0000 mg | Freq: Once | INTRAVENOUS | Status: AC
  Administered 2016-02-28: 500 mg via INTRAVENOUS
  Filled 2016-02-28: qty 500

## 2016-02-28 MED ORDER — IPRATROPIUM-ALBUTEROL 0.5-2.5 (3) MG/3ML IN SOLN: 3.0000 mL | RESPIRATORY_TRACT | Status: DC

## 2016-02-28 MED ORDER — GUAIFENESIN 100 MG/5ML PO SOLN
200.0000 mg | Freq: Three times a day (TID) | ORAL | Status: DC | PRN
  Administered 2016-02-29 – 2016-03-02 (×2): 200 mg via ORAL
  Filled 2016-02-28 (×2): qty 5

## 2016-02-28 MED ORDER — ENOXAPARIN SODIUM 80 MG/0.8ML ~~LOC~~ SOLN
80.0000 mg | Freq: Once | SUBCUTANEOUS | Status: DC
  Filled 2016-02-28: qty 0.8

## 2016-02-28 MED ORDER — SODIUM CHLORIDE 0.9 % IV BOLUS (SEPSIS)
1000.0000 mL | Freq: Once | INTRAVENOUS | Status: AC
  Administered 2016-02-28: 1000 mL via INTRAVENOUS

## 2016-02-28 MED ORDER — ENOXAPARIN SODIUM 80 MG/0.8ML ~~LOC~~ SOLN
80.0000 mg | Freq: Two times a day (BID) | SUBCUTANEOUS | Status: DC
  Administered 2016-02-28 – 2016-03-02 (×7): 80 mg via SUBCUTANEOUS
  Filled 2016-02-28 (×8): qty 0.8

## 2016-02-28 MED ORDER — DEXTROSE 5 % IV SOLN
1.0000 g | Freq: Once | INTRAVENOUS | Status: AC
  Administered 2016-02-28: 1 g via INTRAVENOUS
  Filled 2016-02-28: qty 10

## 2016-02-28 MED ORDER — ASPIRIN 325 MG PO TABS
325.0000 mg | ORAL_TABLET | Freq: Every day | ORAL | Status: DC
  Administered 2016-02-29 – 2016-03-04 (×5): 325 mg via ORAL
  Filled 2016-02-28 (×5): qty 1

## 2016-02-28 MED ORDER — CARVEDILOL 6.25 MG PO TABS
6.2500 mg | ORAL_TABLET | Freq: Two times a day (BID) | ORAL | Status: DC
  Administered 2016-02-28 – 2016-03-04 (×10): 6.25 mg via ORAL
  Filled 2016-02-28 (×10): qty 1

## 2016-02-28 MED ORDER — ATORVASTATIN CALCIUM 40 MG PO TABS
40.0000 mg | ORAL_TABLET | Freq: Every day | ORAL | Status: DC
  Administered 2016-02-28 – 2016-03-03 (×4): 40 mg via ORAL
  Filled 2016-02-28 (×5): qty 1

## 2016-02-28 NOTE — ED Notes (Signed)
EDP reassessed patient after 500 mL of first bolus.

## 2016-02-28 NOTE — ED Notes (Signed)
Critical care at bedside  

## 2016-02-28 NOTE — ED Notes (Signed)
Patient transported to Ultrasound 

## 2016-02-28 NOTE — Progress Notes (Addendum)
Bilateral lower extremity duplex was attempted. Patient became combative and wanted to get up to use the bathroom. He was sat on the side of the bed twice with no results. He was continually removing the oxygen cannula. I decided to stop the small portion of the study that was completed to return him to the ED. On arrival to the ED he had become hypoxic even on O2 and was again placed on BiPAP. Dr Konrad DoloresMerrell came to the bedside for evaluation.

## 2016-02-28 NOTE — H&P (Signed)
History and Physical    Steven Yang KXF:[id number] DOB: 04/09/1922 DOA: 02/28/2016   PCP: Ezequiel Kayser, MD   Patient coming from:  Home   Chief Complaint: Shortness of Breath   HPI: Steven Yang is a 82 y.o. male with medical history significant for HTN, HLD, Alzheimer's dementia who was in his usual state of health until  last night when he became dyspneic and was brought to the ED by EMS for management of his symptoms. History is obtained by son due to the patient's dementia. Patient could lie down due to shortness of breath. He was febrile  Had + green  productive cough or hemoptysis. No increased confusion Son reports sick contacts on patient's wife, who is with "stomach flu". No recent trips.No recent surgeries or hospitalizations. licves at home. Son denies any other complaints.    ED Course:  BP 140/67 mmHg  Pulse 78  Temp(Src) 101.1 F (38.4 C) (Rectal)  Resp 23  Ht 5\' 9"  (1.753 m)  Wt 87.544 kg (193 lb)  BMI 28.49 kg/m2  SpO2 97% . Currently on BiPAP Lactic acid 2.36-> 2.94  with repeat pending and elevated white blood cell count at 15.2. ABG shows pH of 7.37, PCO2 39 and PO2 of 107. DDmer 1.98 BNP normal  Neg nitirte in urineHe is treated with BiPAP, Rocephin, Zithromax, and IV fluids  Review of Systems: As per HPI otherwise 10 point review of systems negative.   Past Medical History  Diagnosis Date  . CAD (coronary artery disease)     2002 CABG  . HTN (hypertension)   . Hypercholesteremia   . C. difficile colitis   . Diverticulosis   . Alzheimer's disease MILD  . Dementia     Past Surgical History  Procedure Laterality Date  . Total hip arthroplasty    . Cholecystectomy    . Coronary artery bypass graft  11/2000     LIMA GRAFT TO LAD. SAPHENOUS VEIN GRAFT TO THE SECOND DIAGONAL, OM AND POSTERIOR LATERAL BRANCHES OF THE CIRCUMFLEX CORONARY  . Total hip arthroplasty      right side     reports that he quit smoking about 32 years ago. His smoking use  included Cigarettes. He does not have any smokeless tobacco history on file. He reports that he does not drink alcohol. His drug history is not on file. Walks with walker    Allergies  Allergen Reactions  . Aricept [Donepezil Hydrochloride]     unknown    Family History  Problem Relation Age of Onset  . Heart attack Father   . Coronary artery disease Father   . Cancer Brother   . Pneumonia Brother   . Other Brother     [employer]      Prior to Admission medications   Medication Sig Start Date End Date Taking? Authorizing Provider  aspirin 325 MG tablet Take 325 mg by mouth daily.    Historical Provider, MD  carvedilol (COREG) 12.5 MG tablet Take 6.25 mg by mouth 2 (two) times daily.      Historical Provider, MD  cyanocobalamin 1000 MCG tablet Take 1,000 mcg by mouth daily.    Historical Provider, MD  FLUoxetine (PROZAC) 20 MG capsule Take 40 mg by mouth daily.    Historical Provider, MD  galantamine (RAZADYNE ER) 16 MG 24 hr capsule Take 16 mg by mouth daily.      Historical Provider, MD  guaiFENesin (MUCINEX CHEST CONGESTION CHILD) 100 MG/5ML liquid Take 200 mg by  mouth 3 (three) times daily as needed for cough.    Historical Provider, MD  HYDROcodone-acetaminophen (VICODIN) 5-500 MG per tablet Take 1 tablet by mouth 3 times/day as needed-between meals & bedtime for pain.     Historical Provider, MD  LACTOBACILLUS PO Take 2 tablets by mouth 2 (two) times daily.      Historical Provider, MD  Magnesium Hydroxide (MILK OF MAGNESIA CONCENTRATE PO) Take 30 mg by mouth daily as needed (constipation).    Historical Provider, MD  memantine (NAMENDA) 10 MG tablet Take 10 mg by mouth 2 (two) times daily.    Historical Provider, MD  OMEGA 3 1000 MG CAPS Take 2 capsules by mouth daily.      Historical Provider, MD  polyethylene glycol (MIRALAX) packet Take 17 g by mouth daily. 06/22/15   Jerelyn Scott, MD  senna-docusate (SENOKOT-S) 8.6-50 MG per tablet Take 2 tablets by mouth once.    Historical  Provider, MD  simvastatin (ZOCOR) 80 MG tablet Take 40 mg by mouth daily.    Historical Provider, MD  Vitamin D, Ergocalciferol, (DRISDOL) 50000 UNITS CAPS capsule Take 50,000 Units by mouth every 7 (seven) days. Wednesday    Historical Provider, MD    Physical Exam:    Filed Vitals:   02/28/16 0930 02/28/16 1000 02/28/16 1015 02/28/16 1036  BP: 101/90 141/77 140/67   Pulse: 86 123 92 78  Temp:      TempSrc:      Resp: 23 21  23   Height:      Weight:      SpO2: 97% 99% 98% 97%      Constitutional: awake, somewhat agitated due to dyspnea Filed Vitals:   02/28/16 0930 02/28/16 1000 02/28/16 1015 02/28/16 1036  BP: 101/90 141/77 140/67   Pulse: 86 123 92 78  Temp:      TempSrc:      Resp: 23 21  23   Height:      Weight:      SpO2: 97% 99% 98% 97%   Eyes: PERRL, lids and conjunctivae normal ENMT: Pt on Bipap, unable to assess Neck: normal, supple, no masses, no thyromegaly Respiratory: decreased breath sounds on the left, rhonchi and crackles noted no wheezing, some work of breathing noted with mild accessory muscle use.  Cardiovascular: Regular rate and rhythm, no murmurs / rubs / gallops. No extremity edema. 2+ pedal pulses. No carotid bruits.  Abdomen: no tenderness, no masses palpated. No hepatosplenomegaly. Bowel sounds positive.  Musculoskeletal: no clubbing / cyanosis. No joint deformity upper and lower extremities. Good ROM, no contractures. Normal muscle tone.  Skin: no rashes, lesions, ulcers. No induration Neurologic: Unable to obtain due to patient's dementia  Psychiatric: awake, but somewhat agitated due to SOB.     Labs on Admission: I have personally reviewed following labs and imaging studies  CBC:  Recent Labs Lab 02/28/16 0737  WBC 15.2*  NEUTROABS 13.2*  HGB 12.8*  HCT 38.9*  MCV 94.2  PLT 206    Basic Metabolic Panel:  Recent Labs Lab 02/28/16 0737  NA 138  K 3.7  CL 106  CO2 23  GLUCOSE 153*  BUN 10  CREATININE 0.97  CALCIUM  9.2    GFR: Estimated Creatinine Clearance: 53.2 mL/min (by C-G formula based on Cr of 0.97).  Liver Function Tests:  Recent Labs Lab 02/28/16 0737  AST 39  ALT 22  ALKPHOS 64  BILITOT 2.3*  PROT 6.5  ALBUMIN 3.4*   No results for input(s):  LIPASE, AMYLASE in the last 168 hours. No results for input(s): AMMONIA in the last 168 hours.  Coagulation Profile: No results for input(s): INR, PROTIME in the last 168 hours.  Cardiac Enzymes: No results for input(s): CKTOTAL, CKMB, CKMBINDEX, TROPONINI in the last 168 hours.  BNP (last 3 results) No results for input(s): PROBNP in the last 8760 hours.  HbA1C: No results for input(s): HGBA1C in the last 72 hours.  CBG: No results for input(s): GLUCAP in the last 168 hours.  Lipid Profile: No results for input(s): CHOL, HDL, LDLCALC, TRIG, CHOLHDL, LDLDIRECT in the last 72 hours.  Thyroid Function Tests: No results for input(s): TSH, T4TOTAL, FREET4, T3FREE, THYROIDAB in the last 72 hours.  Anemia Panel: No results for input(s): VITAMINB12, FOLATE, FERRITIN, TIBC, IRON, RETICCTPCT in the last 72 hours.  Urine analysis:    Component Value Date/Time   COLORURINE AMBER BIOCHEMICALS MAY BE AFFECTED BY COLOR* 03/13/2009 1549   APPEARANCEUR TURBID* 03/13/2009 1549   LABSPEC 1.015 03/13/2009 1549   PHURINE 5.5 03/13/2009 1549   GLUCOSEU NEGATIVE 03/13/2009 1549   HGBUR LARGE* 03/13/2009 1549   BILIRUBINUR SMALL* 03/13/2009 1549   KETONESUR 15* 03/13/2009 1549   PROTEINUR >300* 03/13/2009 1549   UROBILINOGEN 0.2 03/13/2009 1549   NITRITE NEGATIVE 03/13/2009 1549   LEUKOCYTESUR TRACE* 03/13/2009 1549    Sepsis Labs: @LABRCNTIP (procalcitonin:4,lacticidven:4) )No results found for this or any previous visit (from the past 240 hour(s)).   Radiological Exams on Admission: Dg Chest Portable 1 View  02/28/2016  CLINICAL DATA:  Shortness of breath beginning last night. EXAM: PORTABLE CHEST 1 VIEW COMPARISON:  02/27/2009  FINDINGS: Sequelae of prior CABG are again identified. There is new mild-to-moderate enlargement of the cardiac silhouette. The lungs are hypoinflated with diffusely increased interstitial markings bilaterally. There is elevation of the left hemidiaphragm with asymmetric left basilar lung opacity. No large pleural effusion or pneumothorax is identified. Degenerative changes are noted about the shoulders. IMPRESSION: 1. New cardiomegaly with bilateral interstitial densities which may reflect mild edema. 2. Left hemidiaphragm elevation with likely left basilar atelectasis. Electronically Signed   By: Sebastian AcheAllen  Grady M.D.   On: 02/28/2016 08:28    EKG: Independently reviewed.  Assessment/Plan Principal Problem:   CAP (community acquired pneumonia) Active Problems:   Atrial flutter (HCC)   Left bundle branch block     Sepsis due to Acute Respiratory Failure with Hypoxia  likely due to LLL CAP. Patient meets sepsis criteria based on vital sign changes lactic acidosis and hypoxia. Lactic acid 2.36_>2.94 . T max 101    CBC remarkable for leukocytosis, WBC 15.2   patient's ABG shows pH of 7.37, PCO2 39 and PO2 of 107. DDmer 1.98 BNP normal  Neg nitite in urineHe was treated with BiPAP now on O2, Rocephin, Zithromax, and IV fluids  Will place patient on oxygen and titrate O2 amount and delivery system as needed. She is on 8 l NRB. Received  1 l NS to date Sepsis order set  Droplet precaution IV antibiotics by pharmacy with  Rocephin and Zithromax  Xopenex Follow lactic acid Follow blood and urine cultures  3 l IV fluids  Procalcitonin UA, cultures CHeck lower extremity dopplers       Leukocytosis, likely reactive, related to underlying infection. WBC 15.2  CXR as above . Febrile -  antibiotics as above Blood and sputum cultures  -  Repeat CBC in AM   Hypertension BP 140/67 mmHg  Pulse 78  Temp(Src) 101.1 F (38.4 C) (Rectal)  Resp 23  Ht  (1.753 m)  Wt 87.544 kg (193 lb)  BMI 28.49  kg/m2  SpO2 97% Controlled Continue home anti-hypertensive medications.  Add Hydralazine Q6 hours as needed for BP 160/90    Hyperlipidemia Continue home Zocor   Atrial Flutter. CHADSVASC 3-4 EKG w A flutter w LBBB, ? Anterior infarct , apparently new finding QTC 479  Check serial troponins Repeat EKG to confirm 2 D echo   Start full dose anticoagulation HasBled score 3 Although he may not be a good candidate for it due to  His comorbidities, fall risk and dementia. Will defer to Cards   Alzheimer's Dementia Continue Namenda   DVT prophylaxis: Lovenox  Code Status:   Full   Needs to be revised  Family Communication:  Discussed with son Disposition Plan: Expect patient to be discharged to SNF after discharge as wife is sick and cannot care for him at this time Consults called:    Cardiology  Admission status: Inpatient Medical floor , may need SDU if unable to wean off of  BiPAP   Steven Homesley E, PA-C [facility]   If 7PM-7AM, please contact night-coverage [email or web address] Password [email or web address]  02/28/2016, 11:10 AM

## 2016-02-28 NOTE — ED Provider Notes (Addendum)
CSN: [id number]     Arrival date & time 02/28/16  7829 History   First MD Initiated Contact with Patient 02/28/16 [phone]     Chief Complaint  Patient presents with  . Shortness of Breath    Level V caveat patient is unable to give history and only historian available a son who has not been with him during the past 24 hours HPI  This is a 82 year old male who has some dementia but has been living at home with his wife and walking with a walker and is normally conversant with family members. History is obtained from his son who is at bedside. The patient's wife told the son that he became sick last night during the night with dyspnea and some complaints of abdominal pain. He was in his usual state of health yesterday. They have not noted whether or not there has been coughing, nausea, vomiting, or diarrhea. Per report up until today he has been in his normal health and has been eating and going about his normal activities.  Past Medical History  Diagnosis Date  . CAD (coronary artery disease)     2002 CABG  . HTN (hypertension)   . Hypercholesteremia   . C. difficile colitis   . Diverticulosis   . Alzheimer's disease MILD  . Dementia    Past Surgical History  Procedure Laterality Date  . Total hip arthroplasty    . Cholecystectomy    . Coronary artery bypass graft  11/2000     LIMA GRAFT TO LAD. SAPHENOUS VEIN GRAFT TO THE SECOND DIAGONAL, OM AND POSTERIOR LATERAL BRANCHES OF THE CIRCUMFLEX CORONARY  . Total hip arthroplasty      right side   Family History  Problem Relation Age of Onset  . Heart attack Father   . Coronary artery disease Father   . Cancer Brother   . Pneumonia Brother   . Other Brother     [employer]   Social History  Substance Use Topics  . Smoking status: Former Smoker    Types: Cigarettes    Quit date: 12/02/1983  . Smokeless tobacco: None  . Alcohol Use: No    Review of Systems  Unable to perform ROS: Dementia      Allergies  Aricept  Home  Medications   Prior to Admission medications   Medication Sig Start Date End Date Taking? Authorizing Provider  aspirin 325 MG tablet Take 325 mg by mouth daily.    Historical Provider, MD  carvedilol (COREG) 12.5 MG tablet Take 6.25 mg by mouth 2 (two) times daily.      Historical Provider, MD  cyanocobalamin 1000 MCG tablet Take 1,000 mcg by mouth daily.    Historical Provider, MD  FLUoxetine (PROZAC) 20 MG capsule Take 40 mg by mouth daily.    Historical Provider, MD  galantamine (RAZADYNE ER) 16 MG 24 hr capsule Take 16 mg by mouth daily.      Historical Provider, MD  guaiFENesin (MUCINEX CHEST CONGESTION CHILD) 100 MG/5ML liquid Take 200 mg by mouth 3 (three) times daily as needed for cough.    Historical Provider, MD  HYDROcodone-acetaminophen (VICODIN) 5-500 MG per tablet Take 1 tablet by mouth 3 times/day as needed-between meals & bedtime for pain.     Historical Provider, MD  LACTOBACILLUS PO Take 2 tablets by mouth 2 (two) times daily.      Historical Provider, MD  Magnesium Hydroxide (MILK OF MAGNESIA CONCENTRATE PO) Take 30 mg by mouth daily as needed (constipation).  Historical Provider, MD  memantine (NAMENDA) 10 MG tablet Take 10 mg by mouth 2 (two) times daily.    Historical Provider, MD  OMEGA 3 1000 MG CAPS Take 2 capsules by mouth daily.      Historical Provider, MD  polyethylene glycol (MIRALAX) packet Take 17 g by mouth daily. 06/22/15   Jerelyn ScottMartha Linker, MD  senna-docusate (SENOKOT-S) 8.6-50 MG per tablet Take 2 tablets by mouth once.    Historical Provider, MD  simvastatin (ZOCOR) 80 MG tablet Take 40 mg by mouth daily.    Historical Provider, MD  Vitamin D, Ergocalciferol, (DRISDOL) 50000 UNITS CAPS capsule Take 50,000 Units by mouth every 7 (seven) days. Wednesday    Historical Provider, MD   BP 101/90 mmHg  Pulse 86  Temp(Src) 101.1 F (38.4 C) (Rectal)  Resp 23  Ht 5\' 9"  (1.753 m)  Wt 87.544 kg  BMI 28.49 kg/m2  SpO2 97% Physical Exam  Constitutional: He appears  well-developed and well-nourished. He appears distressed.  HENT:  Head: Normocephalic and atraumatic.  Eyes: Conjunctivae and EOM are normal. Pupils are equal, round, and reactive to light.  Neck: Normal range of motion. Neck supple.  Cardiovascular: Tachycardia present.   Pulmonary/Chest: He has rales.  Tachypnea with Rales bilateral bases  Abdominal: Soft. Bowel sounds are normal.  Musculoskeletal: Normal range of motion. He exhibits no edema.  Neurological: He is alert.  Patient is alert and says he has no complaints  Skin: Skin is warm and dry.  Psychiatric: He has a normal mood and affect.  Nursing note and vitals reviewed.   ED Course  Procedures (including critical care time) Labs Review Labs Reviewed  COMPREHENSIVE METABOLIC PANEL - Abnormal; Notable for the following:    Glucose, Bld 153 (*)    Albumin 3.4 (*)    Total Bilirubin 2.3 (*)    All other components within normal limits  CBC WITH DIFFERENTIAL/PLATELET - Abnormal; Notable for the following:    WBC 15.2 (*)    RBC 4.13 (*)    Hemoglobin 12.8 (*)    HCT 38.9 (*)    Neutro Abs 13.2 (*)    All other components within normal limits  I-STAT CG4 LACTIC ACID, ED - Abnormal; Notable for the following:    Lactic Acid, Venous 2.36 (*)    All other components within normal limits  I-STAT ARTERIAL BLOOD GAS, ED - Abnormal; Notable for the following:    pO2, Arterial 109.0 (*)    All other components within normal limits  CULTURE, BLOOD (ROUTINE X 2)  CULTURE, BLOOD (ROUTINE X 2)  URINE CULTURE  URINALYSIS, ROUTINE W REFLEX MICROSCOPIC (NOT AT [facility])  BLOOD GAS, ARTERIAL  BRAIN NATRIURETIC PEPTIDE  D-DIMER, QUANTITATIVE (NOT AT [facility])  I-STAT TROPOININ, ED  Rosezena SensorI-STAT TROPOININ, ED    Imaging Review Dg Chest Portable 1 View  02/28/2016  CLINICAL DATA:  Shortness of breath beginning last night. EXAM: PORTABLE CHEST 1 VIEW COMPARISON:  02/27/2009 FINDINGS: Sequelae of prior CABG are again identified. There is new  mild-to-moderate enlargement of the cardiac silhouette. The lungs are hypoinflated with diffusely increased interstitial markings bilaterally. There is elevation of the left hemidiaphragm with asymmetric left basilar lung opacity. No large pleural effusion or pneumothorax is identified. Degenerative changes are noted about the shoulders. IMPRESSION: 1. New cardiomegaly with bilateral interstitial densities which may reflect mild edema. 2. Left hemidiaphragm elevation with likely left basilar atelectasis. Electronically Signed   By: Sebastian AcheAllen  Grady M.D.   On: 02/28/2016 08:28  I have personally reviewed and evaluated these images and lab results as part of my medical decision-making.    EKG Interpretation  Date/Time:  Saturday February 28 2016 10:02:21 EDT Ventricular Rate:  84 PR Interval:  215 QRS Duration: 116 QT Interval:  405 QTC Calculation: 479 R Axis:   -36 Text Interpretation:  Atrial flutter Incomplete left bundle branch block Borderline low voltage, extremity leads Consider anterior infarct Confirmed by Qiara Minetti MD, Duwayne Heck ([id number]) on 02/28/2016 10:05:55 AM       MDM   Final diagnoses:  CAP (community acquired pneumonia)   82 year old male history of coronary artery disease, hypertension, and dementia who presents today with acute onset of dyspnea. He is febrile here to 101.1, has diffuse infiltrates versus edema, and elevated white blood cell count noted. ABG shows pH of 7.37, PCO2 39 and PO2 of 107.  He is treated with BiPAP, Rocephin, Zithromax, and IV fluids. His heart rate has decreased from 10/18/1978. His blood pressure has been high normal to low normal now. Initial lactic acid is elevated at 2.7. Repeat is pending. Patient to be admitted to step down unit. I did discuss with his son that CODE STATUS will need to be addressed. Son states he needs to speak to his mother. Repeat EKG done and patient in atrial flutter with rate of 84 Sepsis - Repeat Assessment  Performed at:    10:00  AM  Vitals     Blood pressure 101/90, pulse 86, temperature 101.1 F (38.4 C), temperature source Rectal, resp. rate 23, height  (1.753 m), weight 87.544 kg, SpO2 97 %.  Heart:     Regular rate and rhythm  Lungs:    Rales  Capillary Refill:   <2 sec  Peripheral Pulse:   Radial pulse palpable  Skin:     Pale  Sats 100% on bipap.   Repeat ekg atrial flutter with ventricular rate of 84.   Discussed care with Willette Cluster, on for hospitalist and plan admission.    1- CAP 2- elevated lactic acid 3- atrial flutter 4-respiratory distress 2 to #1  CRITICAL CARE Performed by: Hilario Quarry Total critical care time: 60 minutes Critical care time was exclusive of separately billable procedures and treating other patients. Critical care was necessary to treat or prevent imminent or life-threatening deterioration. Critical care was time spent personally by me on the following activities: development of treatment plan with patient and/or surrogate as well as nursing, discussions with consultants, evaluation of patient's response to treatment, examination of patient, obtaining history from patient or surrogate, ordering and performing treatments and interventions, ordering and review of laboratory studies, ordering and review of radiographic studies, pulse oximetry and re-evaluation of patient's condition.  Margarita Grizzle, MD 02/28/16 [id number]  Margarita Grizzle, MD 02/28/16 1007

## 2016-02-28 NOTE — ED Notes (Signed)
Dr. Konrad DoloresMerrell notified of elevated lactic acid result.

## 2016-02-28 NOTE — ED Notes (Signed)
Pt. Transported to vascular at this time.  

## 2016-02-28 NOTE — Progress Notes (Signed)
ANTICOAGULATION CONSULT NOTE - Initial Consult  Pharmacy Consult for Enoxaparin Indication: atrial fibrillation  Allergies  Allergen Reactions  . Aricept [Donepezil Hydrochloride]     unknown    Patient Measurements: Height: 5\' 9"  (175.3 cm) Weight: 193 lb (87.544 kg) IBW/kg (Calculated) : 70.7  Vital Signs: Temp: 101.1 F (38.4 C) (06/03 0733) Temp Source: Rectal (06/03 0733) BP: 140/67 mmHg (06/03 1015) Pulse Rate: 78 (06/03 1036)  Labs:  Recent Labs  02/28/16 0737  HGB 12.8*  HCT 38.9*  PLT 206  CREATININE 0.97    Estimated Creatinine Clearance: 53.2 mL/min (by C-G formula based on Cr of 0.97).   Medical History: Past Medical History  Diagnosis Date  . CAD (coronary artery disease)     2002 CABG  . HTN (hypertension)   . Hypercholesteremia   . C. difficile colitis   . Diverticulosis   . Alzheimer's disease MILD  . Dementia     Assessment: 82 yo M presents on 6/3 with SOB. Found to be in Afib. No anticoag PTA. Pharmacy consulted to start Lovenox for Afib. Hgb 12.8, plts wnl. SCr stable, CrCl ~50-3260ml/min.  Goal of Therapy:  Monitor platelets by anticoagulation protocol: Yes   Plan: Start enoxaparin 80mg  Staves Q12 Monitor CBC, s/s of bleed  Enzo BiNathan Deneen Slager, PharmD, [facility] Clinical Pharmacist Pager [phone] 02/28/2016 11:32 AM

## 2016-02-28 NOTE — ED Notes (Signed)
Admitting at bedside 

## 2016-02-28 NOTE — Progress Notes (Signed)
RT placed patient on BIPAP per MD order. Patient is tolerating well at this time. Vital signs stable. No complications. Son at bedside. RT will continue to monitor.

## 2016-02-28 NOTE — Progress Notes (Signed)
82 y.o. M admitted from home where he lived with wife. Pt has hx Dementia but has been living with spouse. Hx CABG, HTN, now with new onset Afib. Currently on BiPap due to Respiratory Distress. CM consulted for LTAC prior to admission and immediately referred to CSW as this appears to be a Long Term Nursing Home placement instead of a Acute medical need. CM will sign off but will be available should additional CM needs arise.

## 2016-02-28 NOTE — ED Notes (Signed)
Cardiology at bedside.

## 2016-02-28 NOTE — ED Notes (Signed)
MD at bedside. 

## 2016-02-28 NOTE — Progress Notes (Signed)
RT removed patient from BIPAP and placed on [place]. Vital signs stable at this time. No complications. RN and MD aware. RT will continue to monitor.

## 2016-02-28 NOTE — Progress Notes (Signed)
Patient placed back on bipap due to respiratory distress. RN and MD made aware. Vital signs stable at this time. Patient tolerating well. RT will continue to monitor.

## 2016-02-28 NOTE — H&P (Signed)
PULMONARY / CRITICAL CARE MEDICINE   Name: Steven Yang MRN: [id number] DOB: Dec 17, 1921    ADMISSION DATE:  02/28/2016   REFERRING MD:  EDP  CHIEF COMPLAINT: Cough  HISTORY OF PRESENT ILLNESS:   81 yo with cad and reported purulent sputum X 48 HRS who has required NIMVS in ED and c x r may demonstrate pna. He has a history of Fib/Flutter and is in flutter currently. Due to the fact he is elderly, required NIMVS, has a possible PE, we will admit to ICU for further eval/treatment.   PAST MEDICAL HISTORY :  He  has a past medical history of CAD (coronary artery disease); HTN (hypertension); Hypercholesteremia; C. difficile colitis; Diverticulosis; Alzheimer's disease (MILD); and Dementia.  PAST SURGICAL HISTORY: He  has past surgical history that includes Total hip arthroplasty; Cholecystectomy; Coronary artery bypass graft (11/2000 ); and Total hip arthroplasty.  Allergies  Allergen Reactions  . Aricept [Donepezil Hydrochloride]     unknown    No current facility-administered medications on file prior to encounter.   Current Outpatient Prescriptions on File Prior to Encounter  Medication Sig  . aspirin 325 MG tablet Take 325 mg by mouth daily.  . carvedilol (COREG) 12.5 MG tablet Take 6.25 mg by mouth 2 (two) times daily.    . cyanocobalamin 1000 MCG tablet Take 1,000 mcg by mouth daily.  Marland Kitchen FLUoxetine (PROZAC) 20 MG capsule Take 40 mg by mouth daily.  Marland Kitchen galantamine (RAZADYNE ER) 16 MG 24 hr capsule Take 16 mg by mouth daily.    Marland Kitchen guaiFENesin (MUCINEX CHEST CONGESTION CHILD) 100 MG/5ML liquid Take 200 mg by mouth 3 (three) times daily as needed for cough.  Marland Kitchen HYDROcodone-acetaminophen (VICODIN) 5-500 MG per tablet Take 1 tablet by mouth 3 times/day as needed-between meals & bedtime for pain.   Marland Kitchen LACTOBACILLUS PO Take 2 tablets by mouth 2 (two) times daily.    . Magnesium Hydroxide (MILK OF MAGNESIA CONCENTRATE PO) Take 30 mg by mouth daily as needed (constipation).  . memantine  (NAMENDA) 10 MG tablet Take 10 mg by mouth 2 (two) times daily.  . OMEGA 3 1000 MG CAPS Take 2 capsules by mouth daily.    . polyethylene glycol (MIRALAX) packet Take 17 g by mouth daily.  Marland Kitchen senna-docusate (SENOKOT-S) 8.6-50 MG per tablet Take 2 tablets by mouth once.  . simvastatin (ZOCOR) 80 MG tablet Take 40 mg by mouth daily.  . Vitamin D, Ergocalciferol, (DRISDOL) 50000 UNITS CAPS capsule Take 50,000 Units by mouth every 7 (seven) days. Wednesday    FAMILY HISTORY:  His indicated that his mother is deceased. He indicated that his father is deceased. He indicated that all of his three sisters are alive. He indicated that all of his three brothers are deceased.   SOCIAL HISTORY: He  reports that he quit smoking about 32 years ago. His smoking use included Cigarettes. He does not have any smokeless tobacco history on file. He reports that he does not drink alcohol.  REVIEW OF SYSTEMS:   Dementia precludes accurate ros  SUBJECTIVE:  NAD at rest  VITAL SIGNS: BP 125/75 mmHg  Pulse 94  Temp(Src) 101.1 F (38.4 C) (Rectal)  Resp 30  Ht 5\' 9"  (1.753 m)  Wt 193 lb (87.544 kg)  BMI 28.49 kg/m2  SpO2 96%  HEMODYNAMICS:    VENTILATOR SETTINGS:    INTAKE / OUTPUT:    PHYSICAL EXAMINATION: General:  EWM NAD at rest Neuro:  Confused as to time and place but  alert and interactive HEENT:  No JVD/LAN Cardiovascular: HSIR IR 103 flutter   Lungs:+rhonchi bilaterally  Abdomen:  Soft + bs Musculoskeletal:  Intact Skin:  Warm/dry/no edema  LABS:  BMET  Recent Labs Lab 02/28/16 0737  NA 138  K 3.7  CL 106  CO2 23  BUN 10  CREATININE 0.97  GLUCOSE 153*    Electrolytes  Recent Labs Lab 02/28/16 0737  CALCIUM 9.2    CBC  Recent Labs Lab 02/28/16 0737  WBC 15.2*  HGB 12.8*  HCT 38.9*  PLT 206    Coag's  Recent Labs Lab 02/28/16 1150  INR 1.08    Sepsis Markers  Recent Labs Lab 02/28/16 0754 02/28/16 0837 02/28/16 1124  LATICACIDVEN 2.36*   --  2.94*  PROCALCITON  --  0.29  --     ABG  Recent Labs Lab 02/28/16 0828 02/28/16 1314  PHART 7.379 7.371  PCO2ART 39.4 32.6*  PO2ART 109.0* 70.0*    Liver Enzymes  Recent Labs Lab 02/28/16 0737  AST 39  ALT 22  ALKPHOS 64  BILITOT 2.3*  ALBUMIN 3.4*    Cardiac Enzymes No results for input(s): TROPONINI, PROBNP in the last 168 hours.  Glucose No results for input(s): GLUCAP in the last 168 hours.  Imaging Dg Chest Port 1 View  02/28/2016  CLINICAL DATA:  Respiratory failure. EXAM: PORTABLE CHEST 1 VIEW COMPARISON:  02/28/2016 at 0803 hours FINDINGS: Sequelae of prior CABG are again identified. Cardiac silhouette remains enlarged. There is persistent elevation of the left hemidiaphragm. Diffuse bilateral interstitial opacities have mildly worsened, particularly in the right lung base. Left basilar opacity is similar to the prior study and likely reflects atelectasis. No sizable pleural effusion or pneumothorax is identified. Degenerative changes are noted about the shoulders. IMPRESSION: Mild worsening of bilateral interstitial opacities which may reflect edema or atypical infection. Electronically Signed   By: Sebastian AcheAllen  Grady M.D.   On: 02/28/2016 13:27   Dg Chest Portable 1 View  02/28/2016  CLINICAL DATA:  Shortness of breath beginning last night. EXAM: PORTABLE CHEST 1 VIEW COMPARISON:  02/27/2009 FINDINGS: Sequelae of prior CABG are again identified. There is new mild-to-moderate enlargement of the cardiac silhouette. The lungs are hypoinflated with diffusely increased interstitial markings bilaterally. There is elevation of the left hemidiaphragm with asymmetric left basilar lung opacity. No large pleural effusion or pneumothorax is identified. Degenerative changes are noted about the shoulders. IMPRESSION: 1. New cardiomegaly with bilateral interstitial densities which may reflect mild edema. 2. Left hemidiaphragm elevation with likely left basilar atelectasis.  Electronically Signed   By: Sebastian AcheAllen  Grady M.D.   On: 02/28/2016 08:28     STUDIES:  6/3 2D>> /3 cta chest>>  CULTURES: 6/3 bc x 2>> 6/3 uc>>  ANTIBIOTICS: 6/3 roc>> 6/3 zithro>>  SIGNIFICANT EVENTS: 6/3 required nimvs  LINES/TUBES:  DISCUSSION: 82 yo with cad and reported purulent sputum X 48 HRS who has required NIMVS in ED and cxr may demonstrate pna. He has a history of Fib/Flutter and is in flutter currently. Due to the fact he is elderly, required NIMVS, has a possible PE, we will admit to ICU for further eval/treatment.  ASSESSMENT / PLAN:  PULMONARY A: Hypoxia Reported purulent sputum(green) Opacity on CxR + D dimer P:   O2 as needed Empiric abx  CTA of chest if possible to ro PE Place on ICU due to age and full code status. May be able to go out of ICU in am.  CARDIOVASCULAR A:  CAD  post CABG x 4 in 2002 Suspected volume overload PAF/Flutter Meets sepsis criteria with lactic acid 2.9 P:  Cards consult noted 2 d PENDING Ro MI May need diuresis  Doubt PE  RENAL Lab Results  Component Value Date   CREATININE 0.97 02/28/2016   CREATININE 0.83 04/27/2009   CREATININE 0.73 04/24/2009    Recent Labs Lab 02/28/16 0737  K 3.7      A:   No acute issue P:   Careful with IV dye on 82 yo  GASTROINTESTINAL A:   GI protection P:   PPI  HEMATOLOGIC A:   No acute issue P:  DVT protection  INFECTIOUS A:   Presumed CAP P:   See abx section  ENDOCRINE A:   No acute issue  P:   Will check tsh with intermittent Flutter   NEUROLOGIC A:   Dementia Intermittent lethargy P:   RASS goal: 0 Freuent orioetation Minimize sedation   FAMILY  - Updates:  - Inter-disciplinary family meet or Palliative Care meeting due by:  June 10th  Brett Canales Minor ACNP [facility] P[facility] Pager [phone] till 3 pm If no answer page [phone] 02/28/2016, 2:10 PM   Attending note: I have seen and examined the patient with nurse practitioner/resident and agree  with the note. History, labs and imaging reviewed.  82 Y/O with alzheimer's dementia, CAD CABG admitted with multilobar CAP Required Bipap in ED New onset Afib. Seen by cardiology.  CT chest reviewed- No PE but shows extensive consolidation.  - Continue broad spectrum antibiotics - Follow cultures, urine legionella, pneumococcus. - Bipap as needed. Full code as per family. - Duonebs for wheezing - Rate control with beta blockers for Afib. Check echo. Cardiology consulted by ED.  Rest of plan as below. Critical care time- 45 mins. This represents my time independent of the NPs time taking care of the pt.  Chilton Greathouse MD [facility] Pager [phone] If no answer or after 3pm call: [phone] 02/28/2016, 4:54 PM

## 2016-02-28 NOTE — ED Notes (Signed)
Pt returned from Vascular, pale, diaphoretic, in resp distress.   MD Ray at bedside, MD Konrad DoloresMerrell at bedside.  Bipap placed on pt by RT,  Xray called to bedside.

## 2016-02-28 NOTE — Clinical Social Work Note (Signed)
Clinical Social Work Assessment  Patient Details  Name: Steven Yang MRN: [id number] Date of Birth: 1922/08/06  Date of referral:  02/28/16               Reason for consult:  Facility Placement                Permission sought to share information with:  Family Supports Permission granted to share information::  Yes, Release of Information Signed  Name::     Steven Yang, Steven Yang, Steven Yang   Agency::     Relationship::  Wife and grandchildren   Contact Information:  Steven Yang [phone]  Housing/Transportation Living arrangements for the past 2 months:  Mobile Home Source of Information:  Spouse Patient Interpreter Needed:    Criminal Activity/Legal Involvement Pertinent to Current Situation/Hospitalization:  No - Comment as needed Significant Relationships:  Spouse, Other Family Members Lives with:  Spouse Do you feel safe going back to the place where you live?  No Need for family participation in patient care:  Yes (Comment)  Care giving concerns:  Wife reported that she is concerned with the patient returning home based on his safety and being unable to care for him at the home. Wife also reported that the patient uses a walker and will get up on his own and has a history of falling. Wife is concerned with the patient's well-being and has agreed to the patient transitioning to a skilled nursing facility.    Social Worker assessment / plan: CSW received consult from RN to speak with patient and family regarding plans for discharge. CSW introduced self and acknowledged the patient. Patient was alert, however appeared to only be oriented to person and place.  CSW discussed the possible need for short term rehabilitation pending PT eval. Patient and family is agreeable to SNF placement if recommended. Patient and family preference is [facility], however agree[place]. CSW will then follow-up with bed offers once  provided. No further concerns reported at this time. CSW to handoff to unit CSW.       Employment status:  Retired Health and safety inspectornsurance information:  [employer], [employer] PT Recommendations:  Skilled Nursing Facility Information / Referral to community resources:     Patient/Family's Response to care:  Patient wife has agreed to a Doctor, hospitalkilled Nursing Facility. Patient wife has requested [facility], but is open to other [place].   Patient/Family's Understanding of and Emotional Response to Diagnosis, Current Treatment, and Prognosis: Patient was not able to verbalize understanding of the discharging process. CSW asked for patient permission to receive further information. Patient was calm and cooperative with CSW while speaking with wife and family.   Emotional Assessment Appearance:  Appears stated age Attitude/Demeanor/Rapport:   (Calm and cooperative ) Affect (typically observed):  Accepting, Calm Orientation:  Oriented to Self, Oriented to Place Alcohol / Substance use:  Tobacco Use Psych involvement (Current and /or in the community):  No (Comment)  Discharge Needs  Concerns to be addressed:  Discharge Planning Concerns Readmission within the last 30 days:  No Current discharge risk:  Physical Impairment, Cognitively Impaired Barriers to Discharge:  Continued Medical Work up   Steven Utilitiesyiesha K Mckay Tegtmeyer, LCSW 02/28/2016, 2:55 PM

## 2016-02-28 NOTE — ED Notes (Signed)
EDP at bedside  

## 2016-02-28 NOTE — ED Notes (Signed)
Admitting MD at bedside.

## 2016-02-28 NOTE — ED Notes (Signed)
X-ray at bedside

## 2016-02-28 NOTE — Consult Note (Addendum)
Cardiology Consult    Patient ID: Steven Yang MRN: [id number], DOB/AGE: 80-Oct-1923   Admit date: 02/28/2016 Date of Consult: 02/28/2016  Primary Physician: Ezequiel Kayser, MD Primary Cardiologist: Dr. [place] Requesting Provider: Dr. Merrell/Internal Medicine  Patient Profile    82 yo male with PMH of 4v CABG 2002/HTN/HLD/Dementia and Alzheimers who presented to the [facility] ED with reports of dyspnea and productive cough x1 day.   Past Medical History   Past Medical History  Diagnosis Date  . CAD (coronary artery disease)     2002 CABG  . HTN (hypertension)   . Hypercholesteremia   . C. difficile colitis   . Diverticulosis   . Alzheimer's disease MILD  . Dementia     Past Surgical History  Procedure Laterality Date  . Total hip arthroplasty    . Cholecystectomy    . Coronary artery bypass graft  11/2000     LIMA GRAFT TO LAD. SAPHENOUS VEIN GRAFT TO THE SECOND DIAGONAL, OM AND POSTERIOR LATERAL BRANCHES OF THE CIRCUMFLEX CORONARY  . Total hip arthroplasty      right side     Allergies  Allergies  Allergen Reactions  . Aricept [Donepezil Hydrochloride]     unknown    History of Present Illness    Mr. Steven Yang is a 82 year old male patient of Dr. Elvis Coil with a past medical history of CABG s/p four-vessel disease in 2002/HTN/HLD/Dementia and Alzheimers. His last visit to the office was 01/22/2015, and at that time family reported he had been very interactive and sleeping more, along with getting increasingly fatigued and short of breath with activity. There were no clear anginal symptoms at that time but was noted to be very deconditioned, home medications were continued without any changes. His last stress test was in 2008 which was noted to be normal. No recent echo on file.  Family reports that at his baseline he is generally confused, and currently lives at home with his wife. Normally ambulates around the home using a walker but has become more sedentary in  the past couple of months. Patient is poor historian given his history of dementia/Alzheimer's. His son reports that he received a phone call from patient's wife this morning around 5:30 AM stating the patient was severely dyspneic and diaphoretic. Also reports that the patient was unable to sleep last night complaining of PND. On the son's arrival to the home and proceeded to call EMS for transport to the ED for further evaluation.  In the ED he was noted to be afebrile at 101.1, had diffuse infiltrates/edema noted on chest x-ray. His ABG showed a pH of 7.37, PCO2 of 39 and PO2 of 107. Was placed on BiPAP for a short period of time but currently on nasal cannula. He was started on Rocephin and Zithromax and IV fluids. Was initially noted to be tachycardic in the 120s on arrival. EKG shows new onset A. fib flutter, with no ST/T-wave abnormalities noted. His labs showed a white blood cell count of 15.2, hemoglobin 12.8, troponins negative 2, lactic acid 2.36, Cr 0.97, and slightly elevated d-dimer of 1.98. He has been admitted to internal medicine for further angina.   Pt was going to [place] after assessment. Cardiology has been consult it in relation to patient's new onset A. fib flutter.  Inpatient Medications    . aspirin  325 mg Oral Daily  . atorvastatin  40 mg Oral q1800  . carvedilol  6.25 mg Oral BID  . enoxaparin (LOVENOX) injection  80 mg Subcutaneous Q12H  . enoxaparin (LOVENOX) injection  80 mg Subcutaneous Once  . FLUoxetine  40 mg Oral Daily  . ipratropium-albuterol  3 mL Nebulization Q4H  . memantine  10 mg Oral BID    Family History    Family History  Problem Relation Age of Onset  . Heart attack Father   . Coronary artery disease Father   . Cancer Brother   . Pneumonia Brother   . Other Brother     [employer]    Social History    Social History   Social History  . Marital Status: Married    Spouse Name: N/A  . Number of Children: N/A  . Years of Education: N/A    Occupational History  . Not on file.   Social History Main Topics  . Smoking status: Former Smoker    Types: Cigarettes    Quit date: 12/02/1983  . Smokeless tobacco: Not on file  . Alcohol Use: No  . Drug Use: Not on file  . Sexual Activity: Not on file   Other Topics Concern  . Not on file   Social History Narrative     Review of Systems    General:  No chills, + fever, night sweats or weight changes.  Cardiovascular:  No chest pain, dyspnea on exertion, edema, orthopnea, palpitations, paroxysmal nocturnal dyspnea. Dermatological: No rash, lesions/masses Respiratory: + productive cough, +dyspnea Urologic: No hematuria, dysuria Abdominal:   No nausea, vomiting, diarrhea, bright red blood per rectum, melena, or hematemesis Neurologic:  No visual changes, + wkns, changes in mental status. All other systems reviewed and are otherwise negative except as noted above.  Physical Exam    Blood pressure 133/91, pulse 86, temperature 101.1 F (38.4 C), temperature source Rectal, resp. rate 23, height 5\' 9"  (1.753 m), weight 193 lb (87.544 kg), SpO2 98 %.  General: Pleasant but frail older male. Psych: Normal affect. Neuro: Alert and oriented X 3. Moves all extremities spontaneously. HEENT: Normal  Neck: Supple without bruits or JVD. Lungs:  Resp regular but labored, course rales/rhochi throughout. Some accessory muscle use. Heart: Irregularly irregular no s3, s4, or murmurs. Abdomen: Soft, non-tender, non-distended, BS + x 4.  Extremities: No clubbing, cyanosis, trace edema to LEE. DP/PT/Radials 2+ and equal bilaterally. Musculoskeletal:  No obvious deformities.    Labs    Troponin [facility] Test)  Recent Labs  02/28/16 0823  TROPIPOC 0.05   No results for input(s): CKTOTAL, CKMB, TROPONINI in the last 72 hours. Lab Results  Component Value Date   WBC 15.2* 02/28/2016   HGB 12.8* 02/28/2016   HCT 38.9* 02/28/2016   MCV 94.2 02/28/2016   PLT 206 02/28/2016     Recent Labs Lab 02/28/16 0737  NA 138  K 3.7  CL 106  CO2 23  BUN 10  CREATININE 0.97  CALCIUM 9.2  PROT 6.5  BILITOT 2.3*  ALKPHOS 64  ALT 22  AST 39  GLUCOSE 153*   Lab Results  Component Value Date   CHOL 107 05/03/2008   HDL 52 05/03/2008   LDLCALC 41 05/03/2008   TRIG 71 05/03/2008   Lab Results  Component Value Date   DDIMER 1.98* 02/28/2016     Radiology Studies    Dg Chest Portable 1 View  02/28/2016  CLINICAL DATA:  Shortness of breath beginning last night. EXAM: PORTABLE CHEST 1 VIEW COMPARISON:  02/27/2009 FINDINGS: Sequelae of prior CABG are again identified. There is new mild-to-moderate enlargement of the  cardiac silhouette. The lungs are hypoinflated with diffusely increased interstitial markings bilaterally. There is elevation of the left hemidiaphragm with asymmetric left basilar lung opacity. No large pleural effusion or pneumothorax is identified. Degenerative changes are noted about the shoulders. IMPRESSION: 1. New cardiomegaly with bilateral interstitial densities which may reflect mild edema. 2. Left hemidiaphragm elevation with likely left basilar atelectasis. Electronically Signed   By: Sebastian Ache M.D.   On: 02/28/2016 08:28    ECG & Cardiac Imaging    EKG: A Flutter Rate-80 LBBB  Assessment & Plan    Mr. Shanks is a 82 year old male patient of Dr. Elvis Coil with a past medical history of CABG s/p four-vessel disease in 2002/HTN/HLD/Dementia and Alzheimers. Son reports that he received a phone call from patient's wife this morning around 5:30 AM stating the patient was severely dyspneic and diaphoretic. Also reports that the patient was unable to sleep last night complaining of PND. On the son's arrival to the home and proceeded to call EMS for transport to the ED for further evaluation.  In the ED he was noted to be afebrile at 101.1, had diffuse infiltrates/edema noted on chest x-ray. His ABG showed a pH of 7.37, PCO2 of 39 and PO2 of 107.  Was placed on BiPAP for a short period of time but currently on nasal cannula. He was started on Rocephin and Zithromax and IV fluids. Was initially noted to be tachycardic in the 120s on arrival. EKG shows new onset A. fib flutter, with no ST/T-wave abnormalities noted. His labs showed a white blood cell count of 15.2, hemoglobin 12.8, troponins negative 2, lactic acid 2.36, Cr 0.97, and slightly elevated d-dimer of 1.98.   1. CAP/Sepsis: Son reports the patients wife recently had a cough at home and was treated as an outpatient. The patient has also been more sedentary and not as physically active over the last couple of months, staying in the bed more often --Initially required Bipap on arrival to the ED but now currently on [place] and tolerating. --Antibiotics per primary team   2. New onset A Flutter: According to family, the patient has no known hx of a-flutter and not currently on any medical therapy for the same. --This is probably 2/2 recent dx of LLL CAP which he is currently being treated for via primary team with antibiotics and fluids.  --Pharmacy has been consulted to initiate on full dose lovenox --This patients CHA2DS2-VASc Score and unadjusted Ischemic Stroke Rate (% per year) is equal to 4.8 % stroke rate/year from a score of 4 Above score calculated as 1 point each if present [CHF, HTN, DM, Vascular=MI/PAD/Aortic Plaque, Age if 65-74, or Male] Above score calculated as 2 points each if present [Age > 75, or Stroke/TIA/TE] --Currently rate controlled, continue ASA/BB. --2D echo pending  3. HLD: Continue on statin.  Check FLP and ALT in am.  4.  ASCAD with remote CABG in 2002.  He denies any chest pain.  Trop negative x 2.  Continue ASA/statin/BB.   Janice Coffin, NP-C Pager [phone] 02/28/2016, 12:09 PM  Patient seen and independently examined with Geoffry Paradise, NP. We discussed all aspects of the encounter. I agree with the assessment and plan as stated  above.  Patient has history of CAD with remote CABG in the past.  He has not had any chest pain but has had increased fatigue and DOE recently.  He is fairly sedentary and uses a walker for ambulation over the past few months. Apparently the  patient developed severe SOB early this am along with diaphoresis and son called EMS.  In Er he was febrile to 101 and was placed on BiPAP transiently for acute SOB.  Chest xray showed diffuse infiltrates c/w edema vs. Infiltrate.  He was tachycardic and found to be in atrial flutter with RVR.  Also noted to have increased WBC and lactic acidosis.  He was admitted to IM with diagnosis of PNA.  He was started on IV fluids per IM and cardiology was consulted for aflutter.  He was started on full dose Lovenox for cardioembolic protection.  The patient then became acutely dyspneic again using accessory muscles of respiration and diaphoresis and was placed back on BiPAP.  On my exam the patient is very lethargic struggling to breathe with diffuse crackles.  Repeat chest xray showed pulmonary edema.  He was given IV Lasix by IM and IVF stopped. Chest CT done for elevated D-Dimer confirmed multifocal PNA with a component of edema.   CCM has been called.  IM is going to discuss code status as patient is full code presently.  Given his advanced age and multiple cormobidities, I think he would be a poor candidate for intubation and likely would have difficulty with extubation.  Son is calling his mother to come to ER to discuss code status.   For now recommend IV lasix to treat acute CHF.  Will get 2D echo to assess LVF.  Agree with Lovenox full dose for now.  If he survives this acute respiratory insult, then will need to address long term anticoagulation, but with his advanced Dementia and difficulty with ambulation, he would not be a very good candidate.    I have spent a total of 35 minutes with patient reviewing hospital records , telemetry, EKGs, labs, discussing patient with  primary admitting team and examining patient as well as establishing an assessment and plan that was discussed with the patient.  > 50% of time was spent in direct patient care.  This patient is critically ill with multiple end organ failures.   Signed: Armanda Magicraci Daiden Coltrane, MD [facility] 02/28/2016

## 2016-02-29 ENCOUNTER — Inpatient Hospital Stay (HOSPITAL_COMMUNITY): Payer: Non-veteran care

## 2016-02-29 LAB — CBC
HCT: 35.8 % — ABNORMAL LOW (ref 39.0–52.0)
Hemoglobin: 12 g/dL — ABNORMAL LOW (ref 13.0–17.0)
MCH: 31.2 pg (ref 26.0–34.0)
MCHC: 33.5 g/dL (ref 30.0–36.0)
MCV: 93 fL (ref 78.0–100.0)
PLATELETS: 164 10*3/uL (ref 150–400)
RBC: 3.85 MIL/uL — AB (ref 4.22–5.81)
RDW: 14.2 % (ref 11.5–15.5)
WBC: 11.8 10*3/uL — AB (ref 4.0–10.5)

## 2016-02-29 LAB — COMPREHENSIVE METABOLIC PANEL
ALT: 35 U/L (ref 17–63)
AST: 46 U/L — ABNORMAL HIGH (ref 15–41)
Albumin: 2.8 g/dL — ABNORMAL LOW (ref 3.5–5.0)
Alkaline Phosphatase: 68 U/L (ref 38–126)
Anion gap: 10 (ref 5–15)
BUN: 9 mg/dL (ref 6–20)
CHLORIDE: 106 mmol/L (ref 101–111)
CO2: 23 mmol/L (ref 22–32)
CREATININE: 0.93 mg/dL (ref 0.61–1.24)
Calcium: 8.7 mg/dL — ABNORMAL LOW (ref 8.9–10.3)
Glucose, Bld: 98 mg/dL (ref 65–99)
POTASSIUM: 3.1 mmol/L — AB (ref 3.5–5.1)
Sodium: 139 mmol/L (ref 135–145)
TOTAL PROTEIN: 6.2 g/dL — AB (ref 6.5–8.1)
Total Bilirubin: 2.6 mg/dL — ABNORMAL HIGH (ref 0.3–1.2)

## 2016-02-29 LAB — POCT I-STAT 3, ART BLOOD GAS (G3+)
ACID-BASE DEFICIT: 3 mmol/L — AB (ref 0.0–2.0)
Bicarbonate: 20.9 mEq/L (ref 20.0–24.0)
O2 Saturation: 92 %
PCO2 ART: 33.7 mmHg — AB (ref 35.0–45.0)
PH ART: 7.399 (ref 7.350–7.450)
TCO2: 22 mmol/L (ref 0–100)
pO2, Arterial: 64 mmHg — ABNORMAL LOW (ref 80.0–100.0)

## 2016-02-29 LAB — LACTIC ACID, PLASMA: Lactic Acid, Venous: 2.3 mmol/L (ref 0.5–2.0)

## 2016-02-29 MED ORDER — POTASSIUM CHLORIDE CRYS ER 20 MEQ PO TBCR
40.0000 meq | EXTENDED_RELEASE_TABLET | Freq: Once | ORAL | Status: AC
  Administered 2016-02-29: 40 meq via ORAL
  Filled 2016-02-29: qty 2

## 2016-02-29 MED ORDER — FUROSEMIDE 10 MG/ML IJ SOLN
40.0000 mg | Freq: Once | INTRAMUSCULAR | Status: AC
  Administered 2016-02-29: 40 mg via INTRAVENOUS
  Filled 2016-02-29: qty 4

## 2016-02-29 MED ORDER — TEMAZEPAM 7.5 MG PO CAPS: 7.5000 mg | ORAL_CAPSULE | Freq: Once | ORAL | Status: DC

## 2016-02-29 MED ORDER — SODIUM CHLORIDE 0.9 % IV BOLUS (SEPSIS)
500.0000 mL | Freq: Once | INTRAVENOUS | Status: AC
  Administered 2016-02-29: 500 mL via INTRAVENOUS

## 2016-02-29 NOTE — Progress Notes (Signed)
eLink Physician-Brief Progress Note Patient Name: Steven Yang DOB: Jul 22, 1922 MRN: [id number]   Date of Service  02/29/2016  HPI/Events of Note  Lactic Acid = 2.3. Likely d/t largely to hepatic dysfunction as T.Bili = 2.6.   eICU Interventions  Will order: 1. Bolus with 0.9 NaCl 500 mL IV over 30 minutes now.      Intervention Category Major Interventions: Acid-Base disturbance - evaluation and management  Sommer,Steven Eugene 02/29/2016, 3:42 PM

## 2016-02-29 NOTE — Progress Notes (Signed)
Pt noted to be continually pulling ECG leads, O2 cannula, sat probe and gown off evn after repeated explaination of necessity to remain on. Pt with short term memory loss, safety mittens applied for Pt safety. Will continue to monitor and assess.

## 2016-02-29 NOTE — Progress Notes (Signed)
CRITICAL VALUE ALERT  Critical value received:  Lactic acid 2.3  Date of notification:  02/29/2016  Time of notification:  1530  Critical value read back:yes  Nurse who received alert:  Kyra LeylandHinshaw, RN  MD notified (1st page):  Dr. Arsenio LoaderSommer Time of first page:  1530  MD notified (2nd page):  Time of second page:  Responding MD:  Dr. Arsenio LoaderSommer  Time MD responded:  [phone]

## 2016-02-29 NOTE — Progress Notes (Signed)
PULMONARY / CRITICAL CARE MEDICINE   Name: Steven Yang MRN: [id number] DOB: 1921-10-05    ADMISSION DATE:  02/28/2016   REFERRING MD:  EDP  CHIEF COMPLAINT: Cough  HISTORY OF PRESENT ILLNESS:   82 yo with cad and reported purulent sputum X 48 HRS who has required NIMVS in ED and c x r may demonstrate pna. He has a history of Fib/Flutter and is in flutter currently. Due to the fact he is elderly, required NIMVS, has a possible PE, we will admit to ICU for further eval/treatment.     SUBJECTIVE:  NAD at rest  VITAL SIGNS: BP 133/75 mmHg  Pulse 75  Temp(Src) 97.5 F (36.4 C) (Oral)  Resp 33  Ht 5\' 9"  (1.753 m)  Wt 190 lb 0.6 oz (86.2 kg)  BMI 28.05 kg/m2  SpO2 99%  HEMODYNAMICS:    VENTILATOR SETTINGS:    INTAKE / OUTPUT: I/O last 3 completed shifts: In: 2825 [P.O.:120; I.V.:2705] Out: 450 [Urine:450]  PHYSICAL EXAMINATION: General:  EWM NAD at rest, remains confused Neuro:  Confused as to time and place but alert and interactive HEENT:  No JVD/LAN Cardiovascular: HSIR IR 74 flutter   Lungs:+rhonchi bilaterally  Abdomen:  Soft + bs Musculoskeletal:  Intact Skin:  Warm/dry/no edema  LABS:  BMET  Recent Labs Lab 02/28/16 0737 02/29/16 0316  NA 138 139  K 3.7 3.1*  CL 106 106  CO2 23 23  BUN 10 9  CREATININE 0.97 0.93  GLUCOSE 153* 98    Electrolytes  Recent Labs Lab 02/28/16 0737 02/29/16 0316  CALCIUM 9.2 8.7*    CBC  Recent Labs Lab 02/28/16 0737 02/29/16 0316  WBC 15.2* 11.8*  HGB 12.8* 12.0*  HCT 38.9* 35.8*  PLT 206 164    Coag's  Recent Labs Lab 02/28/16 1150  INR 1.08    Sepsis Markers  Recent Labs Lab 02/28/16 0837 02/28/16 1124 02/28/16 1415 02/28/16 1807  LATICACIDVEN  --  2.94* 1.8 2.0  PROCALCITON 0.29  --   --   --     ABG  Recent Labs Lab 02/28/16 0828 02/28/16 1314 02/29/16 0626  PHART 7.379 7.371 7.399  PCO2ART 39.4 32.6* 33.7*  PO2ART 109.0* 70.0* 64.0*    Liver Enzymes  Recent  Labs Lab 02/28/16 0737 02/29/16 0316  AST 39 46*  ALT 22 35  ALKPHOS 64 68  BILITOT 2.3* 2.6*  ALBUMIN 3.4* 2.8*    Cardiac Enzymes No results for input(s): TROPONINI, PROBNP in the last 168 hours.  Glucose No results for input(s): GLUCAP in the last 168 hours.  Imaging Ct Angio Chest Pe W/cm &/or Wo Cm  02/28/2016  CLINICAL DATA:  Dementia, coronary bypass, hypertension. New onset atrial fibrillation with respiratory distress. Acute shortness of breath. EXAM: CT ANGIOGRAPHY CHEST WITH CONTRAST TECHNIQUE: Multidetector CT imaging of the chest was performed using the standard protocol during bolus administration of intravenous contrast. Multiplanar CT image reconstructions and MIPs were obtained to evaluate the vascular anatomy. CONTRAST:  100 cc Isovue 370 COMPARISON:  02/28/2016 chest x-ray FINDINGS: Mediastinum/Lymph Nodes: The central and proximal hilar pulmonary arteries appear patent without significant pulmonary embolus. Limited assessment of the small peripheral segmental and subsegmental branches related to the central basilar opacities and motion artifact. Calcific atherosclerosis of the thoracic aorta. Mild thoracic aortic ectasia. Major branch vessel tortuous. Previous coronary bypass changes noted. Native coronary atherosclerosis evident. Mild cardiac enlargement. No pericardial effusion. No significant adenopathy. Right hemidiaphragm is chronically elevated Lungs/Pleura: Diffuse upper lobe and  right lower lobe patchy ground-glass opacities. There is also patchy nodular upper lobe central airspace opacities, worse on the left. Patchy central consolidation in the lingula as well as a left lower lobe. Pattern is compatible with a multilobar consolidative pneumonia, worse in the left lung. Mild superimposed ground-glass opacities could represent a component of edema as well. Trace pleural effusions dependently. Upper abdomen: Punctate nonobstructing left nephrolithiasis evident. Punctate  tiny splenic granulomata noted. No other acute upper abdominal process. Aortic atherosclerosis present. Musculoskeletal: Degenerative changes noted of the spine diffusely. Mild scoliotic curvature. No acute compression fracture. Review of the MIP images confirms the above findings. IMPRESSION: Negative for significant acute central or proximal hilar pulmonary embolus. Limited assessment of the smaller peripheral branches to exclude small emboli. Multifocal patchy airspace process with dense central lingula and left lower lobe consolidation compatible with multifocal pneumonia. Superimposed patchy diffuse ground-glass opacities suggest a component of the edema as well. Chronic elevation of the left hemidiaphragm Aortic atherosclerosis Electronically Signed   By: Judie Petit.  Shick M.D.   On: 02/28/2016 15:41   Dg Chest Port 1 View  02/29/2016  CLINICAL DATA:  Pneumonia EXAM: PORTABLE CHEST 1 VIEW COMPARISON:  03/26/2016 FINDINGS: Patchy bilateral lung opacity. Density is greatest at the left base but there is chronic elevation of the diaphragm. No convincing change from prior. There is an element of superimposed pulmonary edema, especially given septal thickening seen on CT from yesterday. Chronic cardiomegaly, accentuated by elevated left diaphragm. Status post CABG. IMPRESSION: Bilateral pneumonia with superimposed edema, stable from yesterday. Electronically Signed   By: Marnee Spring M.D.   On: 02/29/2016 07:05   Dg Chest Port 1 View  02/28/2016  CLINICAL DATA:  Respiratory failure. EXAM: PORTABLE CHEST 1 VIEW COMPARISON:  02/28/2016 at 0803 hours FINDINGS: Sequelae of prior CABG are again identified. Cardiac silhouette remains enlarged. There is persistent elevation of the left hemidiaphragm. Diffuse bilateral interstitial opacities have mildly worsened, particularly in the right lung base. Left basilar opacity is similar to the prior study and likely reflects atelectasis. No sizable pleural effusion or  pneumothorax is identified. Degenerative changes are noted about the shoulders. IMPRESSION: Mild worsening of bilateral interstitial opacities which may reflect edema or atypical infection. Electronically Signed   By: Sebastian Ache M.D.   On: 02/28/2016 13:27     STUDIES:  6/3 2D>> 6/3 cta chest>> LLL pna, GGO noted, ?edema  CULTURES: 6/3 bc x 2>> 6/3 uc>>  ANTIBIOTICS: 6/3 roc>> 6/3 zithro>>  SIGNIFICANT EVENTS: 6/3 required nimvs 6/4 confused overnight with some agitation.   LINES/TUBES:  DISCUSSION: 82 yo with cad and reported purulent sputum X 48 HRS who has required NIMVS in ED and cxr may demonstrate pna. He has a history of Fib/Flutter and is in flutter currently. Due to the fact he is elderly, required NIMVS, has a possible PE, we will admit to ICU for further eval/treatment. CT with no PE LLL pna and edema ASSESSMENT / PLAN:  PULMONARY A: Hypoxia Reported purulent sputum(green) Opacity on CxR + D dimer CTA , no pe but lll pna P:   O2 as needed Empiric abx  Place on ICU due to age and full code status.Leave in ICU 6/4 CARDIOVASCULAR A:  CAD post CABG x 4 in 2002 Suspected volume overload PAF/Flutter Meets sepsis criteria with lactic acid 2.9, trending down P:  Cards consult noted 2 d PENDING May need diuresis  No PE Consider gentle diuresis 6/4  RENAL Lab Results  Component Value Date  CREATININE 0.93 02/29/2016   CREATININE 0.97 02/28/2016   CREATININE 0.83 04/27/2009    Recent Labs Lab 02/28/16 0737 02/29/16 0316  K 3.7 3.1*      A:   No acute issue P:   Careful with IV dye on 82 yo  GASTROINTESTINAL A:   GI protection P:   PPI  HEMATOLOGIC A:   No acute issue P:  DVT protection  INFECTIOUS A:   Presumed CAP P:   See abx section  ENDOCRINE A:   No acute issue  P:   Will check tsh with intermittent Flutter   NEUROLOGIC A:   Dementia Intermittent lethargy P:   RASS goal: 0 Freuent orioetation Minimize  sedation, dc Restoril    FAMILY  - Updates:  - Inter-disciplinary family meet or Palliative Care meeting due by:  June 10th  Brett CanalesSteve Minor ACNP [facility]ackLe [facility] P[facility] Pager [phone] till 3 pm If no answer page [phone] 02/29/2016, 10:52 AM   Attending note: I have seen and examined the patient with nurse practitioner/resident and agree with the note. History, labs and imaging reviewed.  82 Y/O with alzheimer's dementia, CAD CABG admitted with multilobar CAP Required Bipap in ED New onset Afib. Followed by cardiology. CT chest reviewed- No PE but shows extensive consolidation.  CXR today with stable opacities  - Continue ceft, azithro - Follow cultures, urine legionella, pneumococcus. - Bipap as needed. Full code as per family. - Duonebs for wheezing - Rate control with beta blockers for Afib. Follow echo.   Rest of plan as below. Critical care time- 45 mins. This represents my time independent of the NPs time taking care of the pt.  Chilton GreathousePraveen Monterrio Gerst MD [facility] Pager [phone] If no answer or after 3pm call: [phone] 02/29/2016, 4:41 PM

## 2016-02-29 NOTE — Progress Notes (Signed)
eLink Physician-Brief Progress Note Patient Name: Augustin Schoolingeal D Calandro DOB: 1921/09/30 MRN: [id number]   Date of Service  02/29/2016  HPI/Events of Note  Insomnia. Sitting in chair  eICU Interventions  restoril 7,5mg  x 1     Intervention Category Intermediate Interventions: Other:  Greysyn Vanderberg 02/29/2016, 1:34 AM

## 2016-02-29 NOTE — Progress Notes (Signed)
SUBJECTIVE:  Feels much better this am.  SOB much improved and now on O2 [place]  OBJECTIVE:   Vitals:   Filed Vitals:   02/29/16 0620 02/29/16 0700 02/29/16 0800 02/29/16 0854  BP:  125/74 133/75   Pulse: 84 75 75   Temp:    97.5 F (36.4 C)  TempSrc:    Oral  Resp: 32 27 33   Height:      Weight:      SpO2: 99% 100% 99%    I&O's:   Intake/Output Summary (Last 24 hours) at 02/29/16 1112 Last data filed at 02/29/16 0400  Gross per 24 hour  Intake   2825 ml  Output    450 ml  Net   2375 ml   TELEMETRY: Reviewed telemetry pt in atrial flutter with CVR     PHYSICAL EXAM General: Well developed, well nourished, in no acute distress Head: Eyes PERRLA, No xanthomas.   Normal cephalic and atramatic  Lungs:   Crackles anteriorly Heart:   HRRR S1 S2 Pulses are 2+ & equal. Abdomen: Bowel sounds are positive, abdomen soft and non-tender without masses Extremities:   No clubbing, cyanosis or edema.  DP +1 Neuro: Alert and oriented X 3. Psych:  Good affect, responds appropriately   LABS: Basic Metabolic Panel:  Recent Labs  16/10/96 0737 02/29/16 0316  NA 138 139  K 3.7 3.1*  CL 106 106  CO2 23 23  GLUCOSE 153* 98  BUN 10 9  CREATININE 0.97 0.93  CALCIUM 9.2 8.7*   Liver Function Tests:  Recent Labs  02/28/16 0737 02/29/16 0316  AST 39 46*  ALT 22 35  ALKPHOS 64 68  BILITOT 2.3* 2.6*  PROT 6.5 6.2*  ALBUMIN 3.4* 2.8*   No results for input(s): LIPASE, AMYLASE in the last 72 hours. CBC:  Recent Labs  02/28/16 0737 02/29/16 0316  WBC 15.2* 11.8*  NEUTROABS 13.2*  --   HGB 12.8* 12.0*  HCT 38.9* 35.8*  MCV 94.2 93.0  PLT 206 164   Cardiac Enzymes: No results for input(s): CKTOTAL, CKMB, CKMBINDEX, TROPONINI in the last 72 hours. BNP: Invalid input(s): POCBNP D-Dimer:  Recent Labs  02/28/16 0935  DDIMER 1.98*   Hemoglobin A1C: No results for input(s): HGBA1C in the last 72 hours. Fasting Lipid Panel: No results for input(s): CHOL, HDL,  LDLCALC, TRIG, CHOLHDL, LDLDIRECT in the last 72 hours. Thyroid Function Tests: No results for input(s): TSH, T4TOTAL, T3FREE, THYROIDAB in the last 72 hours.  Invalid input(s): FREET3 Anemia Panel: No results for input(s): VITAMINB12, FOLATE, FERRITIN, TIBC, IRON, RETICCTPCT in the last 72 hours. Coag Panel:   Lab Results  Component Value Date   INR 1.08 02/28/2016   INR 1.2 03/22/2009   INR 1.3 03/21/2009    RADIOLOGY: Ct Angio Chest Pe W/cm &/or Wo Cm  02/28/2016  CLINICAL DATA:  Dementia, coronary bypass, hypertension. New onset atrial fibrillation with respiratory distress. Acute shortness of breath. EXAM: CT ANGIOGRAPHY CHEST WITH CONTRAST TECHNIQUE: Multidetector CT imaging of the chest was performed using the standard protocol during bolus administration of intravenous contrast. Multiplanar CT image reconstructions and MIPs were obtained to evaluate the vascular anatomy. CONTRAST:  100 cc Isovue 370 COMPARISON:  02/28/2016 chest x-ray FINDINGS: Mediastinum/Lymph Nodes: The central and proximal hilar pulmonary arteries appear patent without significant pulmonary embolus. Limited assessment of the small peripheral segmental and subsegmental branches related to the central basilar opacities and motion artifact. Calcific atherosclerosis of the thoracic aorta. Mild thoracic  aortic ectasia. Major branch vessel tortuous. Previous coronary bypass changes noted. Native coronary atherosclerosis evident. Mild cardiac enlargement. No pericardial effusion. No significant adenopathy. Right hemidiaphragm is chronically elevated Lungs/Pleura: Diffuse upper lobe and right lower lobe patchy ground-glass opacities. There is also patchy nodular upper lobe central airspace opacities, worse on the left. Patchy central consolidation in the lingula as well as a left lower lobe. Pattern is compatible with a multilobar consolidative pneumonia, worse in the left lung. Mild superimposed ground-glass opacities could  represent a component of edema as well. Trace pleural effusions dependently. Upper abdomen: Punctate nonobstructing left nephrolithiasis evident. Punctate tiny splenic granulomata noted. No other acute upper abdominal process. Aortic atherosclerosis present. Musculoskeletal: Degenerative changes noted of the spine diffusely. Mild scoliotic curvature. No acute compression fracture. Review of the MIP images confirms the above findings. IMPRESSION: Negative for significant acute central or proximal hilar pulmonary embolus. Limited assessment of the smaller peripheral branches to exclude small emboli. Multifocal patchy airspace process with dense central lingula and left lower lobe consolidation compatible with multifocal pneumonia. Superimposed patchy diffuse ground-glass opacities suggest a component of the edema as well. Chronic elevation of the left hemidiaphragm Aortic atherosclerosis Electronically Signed   By: Judie PetitM.  Shick M.D.   On: 02/28/2016 15:41   Dg Chest Port 1 View  02/29/2016  CLINICAL DATA:  Pneumonia EXAM: PORTABLE CHEST 1 VIEW COMPARISON:  03/26/2016 FINDINGS: Patchy bilateral lung opacity. Density is greatest at the left base but there is chronic elevation of the diaphragm. No convincing change from prior. There is an element of superimposed pulmonary edema, especially given septal thickening seen on CT from yesterday. Chronic cardiomegaly, accentuated by elevated left diaphragm. Status post CABG. IMPRESSION: Bilateral pneumonia with superimposed edema, stable from yesterday. Electronically Signed   By: Marnee SpringJonathon  Watts M.D.   On: 02/29/2016 07:05   Dg Chest Port 1 View  02/28/2016  CLINICAL DATA:  Respiratory failure. EXAM: PORTABLE CHEST 1 VIEW COMPARISON:  02/28/2016 at 0803 hours FINDINGS: Sequelae of prior CABG are again identified. Cardiac silhouette remains enlarged. There is persistent elevation of the left hemidiaphragm. Diffuse bilateral interstitial opacities have mildly worsened,  particularly in the right lung base. Left basilar opacity is similar to the prior study and likely reflects atelectasis. No sizable pleural effusion or pneumothorax is identified. Degenerative changes are noted about the shoulders. IMPRESSION: Mild worsening of bilateral interstitial opacities which may reflect edema or atypical infection. Electronically Signed   By: Sebastian AcheAllen  Grady M.D.   On: 02/28/2016 13:27   Dg Chest Portable 1 View  02/28/2016  CLINICAL DATA:  Shortness of breath beginning last night. EXAM: PORTABLE CHEST 1 VIEW COMPARISON:  02/27/2009 FINDINGS: Sequelae of prior CABG are again identified. There is new mild-to-moderate enlargement of the cardiac silhouette. The lungs are hypoinflated with diffusely increased interstitial markings bilaterally. There is elevation of the left hemidiaphragm with asymmetric left basilar lung opacity. No large pleural effusion or pneumothorax is identified. Degenerative changes are noted about the shoulders. IMPRESSION: 1. New cardiomegaly with bilateral interstitial densities which may reflect mild edema. 2. Left hemidiaphragm elevation with likely left basilar atelectasis. Electronically Signed   By: Sebastian AcheAllen  Grady M.D.   On: 02/28/2016 08:28    Assessment & Plan   Mr. Annalee GentaShoemaker is a 82 year old male patient of Dr. Elvis CoilJordan's with a past medical history of CABG s/p four-vessel disease in 2002/HTN/HLD/Dementia and Alzheimers. Son reports that he received a phone call from patient's wife this morning around 5:30 AM stating the patient was severely  dyspneic and diaphoretic. Also reports that the patient was unable to sleep last night complaining of PND. On the son's arrival to the home and proceeded to call EMS for transport to the ED for further evaluation.  In the ED he was noted to be afebrile at 101.1, had diffuse infiltrates/edema noted on chest x-ray. His ABG showed a pH of 7.37, PCO2 of 39 and PO2 of 107. Was placed on BiPAP for a short period of time but  currently on nasal cannula. He was started on Rocephin and Zithromax and IV fluids. Was initially noted to be tachycardic in the 120s on arrival. EKG shows new onset A. fib flutter, with no ST/T-wave abnormalities noted. His labs showed a white blood cell count of 15.2, hemoglobin 12.8, troponins negative 2, lactic acid 2.36, Cr 0.97, and slightly elevated d-dimer of 1.98.   1. CAP/Sepsis: Son reports the patients wife recently had a cough at home and was treated as an outpatient. The patient has also been more sedentary and not as physically active over the last couple of months, staying in the bed more often --Initially required Bipap on arrival to the ED but now currently on  and tolerating. --chest xray today with bilateral PNA and superimposed edema --Antibiotics per primary team.  WBC trending downward.  2. New onset A Flutter: According to family, the patient has no known hx of a-flutter and not currently on any medical therapy for the same. --This is probably 2/2 recent dx of LLL CAP which he is currently being treated for via primary team with antibiotics and fluids.  --now on full dose Lovenox per pharmacy --This patients CHA2DS2-VASc Score and unadjusted Ischemic Stroke Rate (% per year) is equal to 4.8 % stroke rate/year from a score of 4 Above score calculated as 1 point each if present [CHF, HTN, DM, Vascular=MI/PAD/Aortic Plaque, Age if 65-74, or Male] Above score calculated as 2 points each if present [Age > 75, or Stroke/TIA/TE] --Currently rate controlled, continue ASA/BB for now. If is is started on NOAC would recommend d/c of ASA. --2D echo pending --Need to address long term anticoagulation.  Given his advanced Dementia and difficulty with ambulation, he may not be a very good candidate.   3. HLD: Continue on statin. Check FLP and ALT.  4. ASCAD with remote CABG in 2002. He denies any chest pain. Trop negative x 2. Continue ASA/statin/BB.  5.  Hypokalemia -  replete  6.  Acute diastolic CHF - currently in IV Lasix.  He is net positive 2.3L.  BNP this am is only minimally elevated at 121.  Will give Lasix  IV this am and reassess in am.          Armanda Magic, MD  02/29/2016  11:12 AM

## 2016-02-29 NOTE — Evaluation (Signed)
Physical Therapy Evaluation Patient Details Name: Steven Yang MRN: [id number] DOB: 03/07/1922 Today's Date: 02/29/2016   History of Present Illness  82 yo with cad and reported purulent sputum X 48 HRS, and cxr demonstrates multilobar pna; Dementia, Total hip 2002, CABG  Clinical Impression   Pt admitted with above diagnosis. Pt currently with functional limitations due to the deficits listed below (see PT Problem List).  Pt will benefit from skilled PT to increase their independence and safety with mobility to allow discharge to the venue listed below.       Follow Up Recommendations SNF    Equipment Recommendations  Rolling walker with 5" wheels;3in1 (PT)    Recommendations for Other Services       Precautions / Restrictions Precautions Precautions: Fall      Mobility  Bed Mobility Overal bed mobility: Needs Assistance Bed Mobility: Supine to Sit     Supine to sit: Mod assist     General bed mobility comments: Mod handheld assist to pull to sit  Transfers Overall transfer level: Needs assistance Equipment used: Rolling walker (2 wheeled) Transfers: Sit to/from Stand Sit to Stand: Mod assist         General transfer comment: Light mod assist to power up  Ambulation/Gait Ambulation/Gait assistance: Min assist;+2 safety/equipment Ambulation Distance (Feet): 100 Feet Assistive device: Rolling walker (2 wheeled) Gait Pattern/deviations: Step-through pattern;Trunk flexed     General Gait Details: Cues to self-monitor for activity tolerance, for posture, and for RW proximity  Stairs            Wheelchair Mobility    Modified Rankin (Stroke Patients Only)       Balance Overall balance assessment: Needs assistance   Sitting balance-Leahy Scale: Fair       Standing balance-Leahy Scale: Poor                               Pertinent Vitals/Pain Pain Assessment: No/denies pain    Home Living Family/patient expects to be  discharged to:: Skilled nursing facility Living Arrangements: Spouse/significant other                    Prior Function Level of Independence: Independent with assistive device(s)         Comments: RW full time; wife has been having increasing difficulty caring for him and her disabled son     Hand Dominance        Extremity/Trunk Assessment   Upper Extremity Assessment: Overall WFL for tasks assessed           Lower Extremity Assessment: Generalized weakness         Communication   Communication: No difficulties  Cognition Arousal/Alertness: Awake/alert Behavior During Therapy: WFL for tasks assessed/performed;Impulsive Overall Cognitive Status: History of cognitive impairments - at baseline                      General Comments General comments (skin integrity, edema, etc.): Session conducted on 5 L supplemental O2    Exercises        Assessment/Plan    PT Assessment Patient needs continued PT services  PT Diagnosis Difficulty walking;Generalized weakness   PT Problem List Decreased strength;Decreased activity tolerance;Decreased balance;Decreased mobility;Decreased coordination;Decreased cognition;Decreased knowledge of use of DME;Decreased safety awareness;Decreased knowledge of precautions;Cardiopulmonary status limiting activity  PT Treatment Interventions DME instruction;Gait training;Functional mobility training;Therapeutic activities;Therapeutic exercise;Balance training;Neuromuscular re-education;Cognitive remediation;Patient/family education   PT Goals (  Current goals can be found in the Care Plan section) Acute Rehab PT Goals Patient Stated Goal: Did not stae, but seemed happy to walk PT Goal Formulation: Patient unable to participate in goal setting Time For Goal Achievement: 03/14/16 Potential to Achieve Goals: Good    Frequency Min 2X/week   Barriers to discharge        Co-evaluation               End of Session  Equipment Utilized During Treatment: Gait belt;Oxygen Activity Tolerance: Patient tolerated treatment well Patient left: in chair;with call bell/phone within reach;with chair alarm set Nurse Communication: Mobility status         Time: [id number] PT Time Calculation (min) (ACUTE ONLY): 30 min   Charges:   PT Evaluation $PT Eval Moderate Complexity: 1 Procedure PT Treatments $Gait Training: 8-22 mins   PT G Codes:        Olen PelGarrigan, Tru Rana Hamff 02/29/2016, 1:33 PM  Van ClinesHolly Emannuel [place], [place]PT  Acute Rehabilitation Services Pager [phone] Office [phone]

## 2016-03-01 ENCOUNTER — Inpatient Hospital Stay (HOSPITAL_COMMUNITY): Payer: Non-veteran care

## 2016-03-01 ENCOUNTER — Other Ambulatory Visit (HOSPITAL_COMMUNITY): Payer: Medicare HMO

## 2016-03-01 ENCOUNTER — Telehealth: Payer: Self-pay | Admitting: Cardiology

## 2016-03-01 DIAGNOSIS — I447 Left bundle-branch block, unspecified: Secondary | ICD-10-CM

## 2016-03-01 DIAGNOSIS — I509 Heart failure, unspecified: Secondary | ICD-10-CM

## 2016-03-01 DIAGNOSIS — R791 Abnormal coagulation profile: Secondary | ICD-10-CM

## 2016-03-01 DIAGNOSIS — I4892 Unspecified atrial flutter: Secondary | ICD-10-CM

## 2016-03-01 DIAGNOSIS — J189 Pneumonia, unspecified organism: Secondary | ICD-10-CM

## 2016-03-01 DIAGNOSIS — R0902 Hypoxemia: Secondary | ICD-10-CM

## 2016-03-01 DIAGNOSIS — I483 Typical atrial flutter: Secondary | ICD-10-CM

## 2016-03-01 LAB — BASIC METABOLIC PANEL
ANION GAP: 8 (ref 5–15)
BUN: 11 mg/dL (ref 6–20)
CO2: 24 mmol/L (ref 22–32)
Calcium: 8.8 mg/dL — ABNORMAL LOW (ref 8.9–10.3)
Chloride: 106 mmol/L (ref 101–111)
Creatinine, Ser: 0.78 mg/dL (ref 0.61–1.24)
GFR calc Af Amer: >60 mL/min (ref >60)
GFR calc non Af Amer: >60 mL/min (ref >60)
GLUCOSE: 104 mg/dL — AB (ref 65–99)
POTASSIUM: 3.5 mmol/L (ref 3.5–5.1)
Sodium: 138 mmol/L (ref 135–145)

## 2016-03-01 LAB — CBC
HEMATOCRIT: 33.7 % — AB (ref 39.0–52.0)
Hemoglobin: 11.2 g/dL — ABNORMAL LOW (ref 13.0–17.0)
MCH: 30.9 pg (ref 26.0–34.0)
MCHC: 33.2 g/dL (ref 30.0–36.0)
MCV: 92.8 fL (ref 78.0–100.0)
Platelets: 157 10*3/uL (ref 150–400)
RBC: 3.63 MIL/uL — AB (ref 4.22–5.81)
RDW: 14 % (ref 11.5–15.5)
WBC: 9.9 10*3/uL (ref 4.0–10.5)

## 2016-03-01 LAB — LEGIONELLA PNEUMOPHILA SEROGP 1 UR AG: L. PNEUMOPHILA SEROGP 1 UR AG: NEGATIVE

## 2016-03-01 LAB — URINE CULTURE

## 2016-03-01 LAB — PHOSPHORUS: Phosphorus: 1.6 mg/dL — ABNORMAL LOW (ref 2.5–4.6)

## 2016-03-01 LAB — MAGNESIUM: Magnesium: 1.7 mg/dL (ref 1.7–2.4)

## 2016-03-01 MED ORDER — IPRATROPIUM-ALBUTEROL 0.5-2.5 (3) MG/3ML IN SOLN
3.0000 mL | Freq: Three times a day (TID) | RESPIRATORY_TRACT | Status: DC
  Administered 2016-03-01: 3 mL via RESPIRATORY_TRACT
  Filled 2016-03-01: qty 3

## 2016-03-01 MED ORDER — IPRATROPIUM-ALBUTEROL 0.5-2.5 (3) MG/3ML IN SOLN
3.0000 mL | Freq: Three times a day (TID) | RESPIRATORY_TRACT | Status: DC
  Administered 2016-03-01 – 2016-03-04 (×8): 3 mL via RESPIRATORY_TRACT
  Filled 2016-03-01 (×8): qty 3

## 2016-03-01 MED ORDER — FUROSEMIDE 40 MG PO TABS
40.0000 mg | ORAL_TABLET | Freq: Two times a day (BID) | ORAL | Status: DC
  Filled 2016-03-01: qty 1

## 2016-03-01 MED ORDER — FUROSEMIDE 40 MG PO TABS
40.0000 mg | ORAL_TABLET | Freq: Two times a day (BID) | ORAL | Status: DC
  Administered 2016-03-02 – 2016-03-04 (×5): 40 mg via ORAL
  Filled 2016-03-01 (×5): qty 1

## 2016-03-01 MED ORDER — FUROSEMIDE 10 MG/ML IJ SOLN
40.0000 mg | Freq: Once | INTRAMUSCULAR | Status: AC
  Administered 2016-03-01: 40 mg via INTRAVENOUS
  Filled 2016-03-01: qty 4

## 2016-03-01 MED ORDER — POTASSIUM & SODIUM PHOSPHATES 280-160-250 MG PO PACK
1.0000 | PACK | Freq: Three times a day (TID) | ORAL | Status: AC
Start: 2016-03-01 — End: 2016-03-02
  Administered 2016-03-01 (×2): 1 via ORAL
  Filled 2016-03-01 (×3): qty 1

## 2016-03-01 NOTE — Telephone Encounter (Signed)
New Message:   Please call,says she needs to talk to you.Pt had problems over the weekend and had to be admitted.

## 2016-03-01 NOTE — Progress Notes (Signed)
*  Preliminary Results* Bilateral lower extremity venous duplex completed. Study was technically difficult due to poor patient cooperation. Bilateral lower extremities are negative for deep vein thrombosis. There is no evidence of Baker's cyst bilaterally.  03/01/2016  Gertie FeyMichelle Chrisean Kloth, RVT, RDCS, RDMS

## 2016-03-01 NOTE — NC FL2 (Signed)
[place] MEDICAID FL2 LEVEL OF CARE SCREENING TOOL     IDENTIFICATION  Patient Name: CHENEY EWART Birthdate: 05-17-1922 Sex: male Admission Date (Current Location): 02/28/2016  [facility] and [place] Number:  Producer, television/film/video and Address:  [facility] [place]. [facility], [address] [place], [place] [address]      Provider Number: [id number]  Attending Physician Name and Address:  Chilton Greathouse, MD  Relative Name and Phone Number:       Current Level of Care: Hospital Recommended Level of Care: Skilled Nursing Facility Prior Approval Number:    Date Approved/Denied:   PASRR Number: [id number]  Discharge Plan: SNF    Current Diagnoses: Patient Active Problem List   Diagnosis Date Noted  . CAP (community acquired pneumonia) 02/28/2016  . Atrial flutter (HCC) 02/28/2016  . Left bundle branch block 02/28/2016  . Acute respiratory distress (HCC) 02/28/2016  . Hypoxia 02/28/2016  . FTT (failure to thrive) in adult 02/28/2016  . Acute CHF (congestive heart failure) (HCC) 02/28/2016  . Acute respiratory failure (HCC)   . D-dimer, elevated   . Sepsis (HCC)   . Respiratory failure (HCC)   . CAD (coronary artery disease)   . HTN (hypertension)   . Hypercholesteremia   . C. difficile colitis   . Diverticulosis   . Alzheimer's disease   . Dementia     Orientation RESPIRATION BLADDER Height & Weight     Self  O2 (Nasal Canula 2 L) Continent Weight: 190 lb 0.6 oz (86.2 kg) Height:   (175.3 cm)  BEHAVIORAL SYMPTOMS/MOOD NEUROLOGICAL BOWEL NUTRITION STATUS   (None)  (Dementia) Continent Diet (Heart-healthy)  AMBULATORY STATUS COMMUNICATION OF NEEDS Skin   Limited Assist Verbally Normal                       Personal Care Assistance Level of Assistance  Bathing, Feeding, Dressing Bathing Assistance: Limited assistance Feeding assistance: Independent Dressing Assistance: Limited assistance     Functional Limitations Info  Sight, Hearing,  Speech Sight Info: Adequate Hearing Info: Adequate Speech Info: Adequate    SPECIAL CARE FACTORS FREQUENCY  PT (By licensed PT), Blood pressure     PT Frequency: 5 x week              Contractures Contractures Info: Not present    Additional Factors Info  Code Status, Allergies Code Status Info: Full Allergies Info: Aricept           Current Medications (03/01/2016):  This is the current hospital active medication list Current Facility-Administered Medications  Medication Dose Route Frequency Provider Last Rate Last Dose  . aspirin tablet 325 mg  325 mg Oral Daily Marcos Eke, PA-C   325 mg at 03/01/16 5621  . atorvastatin (LIPITOR) tablet 40 mg  40 mg Oral q1800 Marcos Eke, PA-C   40 mg at 02/29/16 1819  . azithromycin (ZITHROMAX) 500 mg in dextrose 5 % 250 mL IVPB  500 mg Intravenous Q24H Marcos Eke, PA-C 250 mL/hr at 03/01/16 1100 500 mg at 03/01/16 1100  . carvedilol (COREG) tablet 6.25 mg  6.25 mg Oral BID Marcos Eke, PA-C   6.25 mg at 03/01/16 3086  . cefTRIAXone (ROCEPHIN) 1 g in dextrose 5 % 50 mL IVPB  1 g Intravenous Q24H Marcos Eke, PA-C 100 mL/hr at 03/01/16 1011 1 g at 03/01/16 1011  . enoxaparin (LOVENOX) injection 80 mg  80 mg Subcutaneous Q12H Para March  Batchelder, RPH   80 mg at 03/01/16 [id number]  . FLUoxetine (PROZAC) capsule 40 mg  40 mg Oral Daily Marcos EkeSara E Wertman, PA-C   40 mg at 03/01/16 [id number]  . [START ON 03/02/2016] furosemide (LASIX) tablet 40 mg  40 mg Oral BID Leslye Peerobert S Byrum, MD      . guaiFENesin (ROBITUSSIN) 100 MG/5ML solution 200 mg  200 mg Oral TID PRN Marcos EkeSara E Wertman, PA-C   200 mg at 02/29/16 0902  . ipratropium-albuterol (DUONEB) 0.5-2.5 (3) MG/3ML nebulizer solution 3 mL  3 mL Nebulization TID Leslye Peerobert S Byrum, MD      . memantine [facility](NAMENDA) tablet 10 mg  10 mg Oral BID Marcos EkeSara E Wertman, PA-C   10 mg at 03/01/16 [id number]  . potassium & sodium phosphates (PHOS-NAK) 280-160-250 MG packet 1 packet  1 packet Oral TID WC & HS Leslye Peerobert S Byrum, MD   1  packet at 03/01/16 1303     Discharge Medications: Please see discharge summary for a list of discharge medications.  Relevant Imaging Results:  Relevant Lab Results:   Additional Information SS#: [id number]  Margarito LinerSarah C Burnette Valenti, LCSW

## 2016-03-01 NOTE — Telephone Encounter (Signed)
Returned call to patient's wife.She stated husband admitted to hospital this past Sat, he is presently in ICU with CHF and pneumonia.Stated she wanted to let Dr.Jordan know.Stated she wanted him to be in hospital 3 days so when he is ready for discharge she wants him to be transferred to a nursing home.Stated his insurance will pay for 30 days in nursing home if he stays in hospital at least 3 days.Advised I will let Dr.Jordan know.Rosann Auerbachrish was called.

## 2016-03-01 NOTE — Progress Notes (Signed)
PULMONARY / CRITICAL CARE MEDICINE   Name: Steven Yang MRN: [id number] DOB: 11/03/1921    ADMISSION DATE:  02/28/2016   REFERRING MD:  EDP  CHIEF COMPLAINT: Cough  HISTORY OF PRESENT ILLNESS:   82 yo with cad and reported purulent sputum X 48 HRS who has required NIMVS in ED and c x r may demonstrate pna. He has a history of Fib/Flutter and was in A flutter on presentation. Treated for CAP.   SUBJECTIVE:  Up to chair Comfortable on 2L/min [place] Rate controlled, no gtt's  VITAL SIGNS: BP 147/97 mmHg  Pulse 85  Temp(Src) 97.9 F (36.6 C) (Oral)  Resp 31  Ht 5\' 9"  (1.753 m)  Wt 86.2 kg (190 lb 0.6 oz)  BMI 28.05 kg/m2  SpO2 98%  HEMODYNAMICS:    VENTILATOR SETTINGS:    INTAKE / OUTPUT: I/O last 3 completed shifts: In: 2315 [P.O.:840; I.V.:675; IV Piggyback:800] Out: 975 [Urine:975]  PHYSICAL EXAMINATION: General:  EWM NAD at rest in chair Neuro:  Confused as to time and place but alert and interactive HEENT:  No JVD/LAN Cardiovascular: RRR, 90's, no M Lungs:+rhonchi bilaterally  Abdomen:  Soft + bs Musculoskeletal:  Intact Skin:  Warm/dry/no edema  LABS:  BMET  Recent Labs Lab 02/28/16 0737 02/29/16 0316 03/01/16 0710  NA 138 139 138  K 3.7 3.1* 3.5  CL 106 106 106  CO2 23 23 24   BUN 10 9 11   CREATININE 0.97 0.93 0.78  GLUCOSE 153* 98 104*    Electrolytes  Recent Labs Lab 02/28/16 0737 02/29/16 0316 03/01/16 0710  CALCIUM 9.2 8.7* 8.8*  MG  --   --  1.7  PHOS  --   --  1.6*    CBC  Recent Labs Lab 02/28/16 0737 02/29/16 0316 03/01/16 0710  WBC 15.2* 11.8* 9.9  HGB 12.8* 12.0* 11.2*  HCT 38.9* 35.8* 33.7*  PLT 206 164 157    Coag's  Recent Labs Lab 02/28/16 1150  INR 1.08    Sepsis Markers  Recent Labs Lab 02/28/16 0837  02/28/16 1415 02/28/16 1807 02/29/16 1415  LATICACIDVEN  --   < > 1.8 2.0 2.3*  PROCALCITON 0.29  --   --   --   --   < > = values in this interval not displayed.  ABG  Recent Labs Lab  02/28/16 0828 02/28/16 1314 02/29/16 0626  PHART 7.379 7.371 7.399  PCO2ART 39.4 32.6* 33.7*  PO2ART 109.0* 70.0* 64.0*    Liver Enzymes  Recent Labs Lab 02/28/16 0737 02/29/16 0316  AST 39 46*  ALT 22 35  ALKPHOS 64 68  BILITOT 2.3* 2.6*  ALBUMIN 3.4* 2.8*    Cardiac Enzymes No results for input(s): TROPONINI, PROBNP in the last 168 hours.  Glucose No results for input(s): GLUCAP in the last 168 hours.  Imaging Dg Chest Port 1 View  03/01/2016  CLINICAL DATA:  Respiratory failure, shortness of Breath EXAM: PORTABLE CHEST 1 VIEW COMPARISON:  02/29/2016 FINDINGS: Cardiomegaly with changes of prior CABG. Diffuse bilateral airspace disease noted, likely pulmonary edema/ CHF. Focal left lower lobe consolidation concerning for pneumonia. No real change. IMPRESSION: Probable mild CHF. Focal left lower lobe opacity concerning for pneumonia. No change. Electronically Signed   By: Charlett NoseKevin  Dover M.D.   On: 03/01/2016 07:38     STUDIES:  6/3 TTE >>  6/3 cta chest>> LLL pna, GGO noted, ?edema  CULTURES: 6/3 bc x 2>> 6/3 uc>>  ANTIBIOTICS: 6/3 ceftriaxone >> 6/3 azithro >>  SIGNIFICANT EVENTS: 6/3 required nimvs 6/4 confused overnight with some agitation.  6/5 required safety sitter o/n  LINES/TUBES:  DISCUSSION: 82 yo with cad and reported purulent sputum X 48 HRS who has required NIMVS in ED and cxr may demonstrate pna. He has a history of Fib/Flutter. Treated for CAP, diuresed, rate controlled.   ASSESSMENT / PLAN:  PULMONARY A: Hypoxia LLL CAP  + D dimer P:   O2 as needed Abx as below BD's scheduled pulm hygiene  CARDIOVASCULAR A:  CAD post CABG x 4 in 2002 Suspected volume overload PAF/Flutter P:  Cards consult noted Coreg, ASA, lipitor Echocardiogram pending  Gentle diuresis 6/5  RENAL Lab Results  Component Value Date   CREATININE 0.78 03/01/2016   CREATININE 0.93 02/29/2016   CREATININE 0.97 02/28/2016    Recent Labs Lab 02/28/16 0737  02/29/16 0316 03/01/16 0710  K 3.7 3.1* 3.5  A:   Hypophosphatemia  Hypokalemia  P:   Follow BMP Replete electrolytes as indicated  GASTROINTESTINAL A:   GI protection P:   Now taking a PO diet  HEMATOLOGIC A:   No acute issue P:  On therapeutic enoxaparin for A flib/flutter. Note cardiology notes - likely a poor candidate for chronic anticoagulation due to dementia and falls.   INFECTIOUS A:   LLL CAP P:   Ceftriaxone + azithro  ENDOCRINE A:   No acute issue  P:   Will check tsh with intermittent Flutter   NEUROLOGIC A:   Dementia ICU delirium P:   RASS goal: 0 Frequent orientation Minimize sedation [place] scheduled prozac May require sitter for safety   FAMILY  - Updates:  - Inter-disciplinary family meet or Palliative Care meeting due by:  June 10th  Plan for transfer to telemetry bed 6/5   Levy Pupa, MD, PhD 03/01/2016, 8:57 AM [facility] [phone] or if no answer [phone]

## 2016-03-01 NOTE — Progress Notes (Addendum)
Cardiologist: Dr. Swaziland Subjective:  Doing a little bit better, less short of breath, less cough  Presented with shortness of breath, fever 101.1.  Objective:  Vital Signs in the last 24 hours: Temp:  [97.1 F (36.2 C)-98.5 F (36.9 C)] 97.9 F (36.6 C) (06/05 0007) Pulse Rate:  [56-89] 85 (06/05 0700) Resp:  [17-31] 31 (06/05 0700) BP: (84-152)/(49-99) 147/97 mmHg (06/05 0700) SpO2:  [76 %-100 %] 98 % (06/05 0837)  Intake/Output from previous day: 06/04 0701 - 06/05 0700 In: 1520 [P.O.:720; IV Piggyback:800] Out: 800 [Urine:800]   Physical Exam: General: Well developed, well nourished, in no acute distress. Elderly Head:  Normocephalic and atraumatic. Lungs: Mild rhonchi throughout Heart: Normal S1 and S2.  No murmur, rubs or gallops.  Abdomen: soft, non-tender, positive bowel sounds. Extremities: No clubbing or cyanosis. No edema. Neurologic: Alert and oriented x 3.    Lab Results:  Recent Labs  02/29/16 0316 03/01/16 0710  WBC 11.8* 9.9  HGB 12.0* 11.2*  PLT 164 157    Recent Labs  02/29/16 0316 03/01/16 0710  NA 139 138  K 3.1* 3.5  CL 106 106  CO2 23 24  GLUCOSE 98 104*  BUN 9 11  CREATININE 0.93 0.78   No results for input(s): TROPONINI in the last 72 hours.  Invalid input(s): CK, MB Hepatic Function Panel  Recent Labs  02/29/16 0316  PROT 6.2*  ALBUMIN 2.8*  AST 46*  ALT 35  ALKPHOS 68  BILITOT 2.6*   No results for input(s): CHOL in the last 72 hours. No results for input(s): PROTIME in the last 72 hours.  Imaging: Ct Angio Chest Pe W/cm &/or Wo Cm  02/28/2016  CLINICAL DATA:  Dementia, coronary bypass, hypertension. New onset atrial fibrillation with respiratory distress. Acute shortness of breath. EXAM: CT ANGIOGRAPHY CHEST WITH CONTRAST TECHNIQUE: Multidetector CT imaging of the chest was performed using the standard protocol during bolus administration of intravenous contrast. Multiplanar CT image reconstructions and MIPs  were obtained to evaluate the vascular anatomy. CONTRAST:  100 cc Isovue 370 COMPARISON:  02/28/2016 chest x-ray FINDINGS: Mediastinum/Lymph Nodes: The central and proximal hilar pulmonary arteries appear patent without significant pulmonary embolus. Limited assessment of the small peripheral segmental and subsegmental branches related to the central basilar opacities and motion artifact. Calcific atherosclerosis of the thoracic aorta. Mild thoracic aortic ectasia. Major branch vessel tortuous. Previous coronary bypass changes noted. Native coronary atherosclerosis evident. Mild cardiac enlargement. No pericardial effusion. No significant adenopathy. Right hemidiaphragm is chronically elevated Lungs/Pleura: Diffuse upper lobe and right lower lobe patchy ground-glass opacities. There is also patchy nodular upper lobe central airspace opacities, worse on the left. Patchy central consolidation in the lingula as well as a left lower lobe. Pattern is compatible with a multilobar consolidative pneumonia, worse in the left lung. Mild superimposed ground-glass opacities could represent a component of edema as well. Trace pleural effusions dependently. Upper abdomen: Punctate nonobstructing left nephrolithiasis evident. Punctate tiny splenic granulomata noted. No other acute upper abdominal process. Aortic atherosclerosis present. Musculoskeletal: Degenerative changes noted of the spine diffusely. Mild scoliotic curvature. No acute compression fracture. Review of the MIP images confirms the above findings. IMPRESSION: Negative for significant acute central or proximal hilar pulmonary embolus. Limited assessment of the smaller peripheral branches to exclude small emboli. Multifocal patchy airspace process with dense central lingula and left lower lobe consolidation compatible with multifocal pneumonia. Superimposed patchy diffuse ground-glass opacities suggest a component of the edema as well. Chronic elevation of the  left  hemidiaphragm Aortic atherosclerosis Electronically Signed   By: Judie Petit.  Shick M.D.   On: 02/28/2016 15:41   Dg Chest Port 1 View  03/01/2016  CLINICAL DATA:  Respiratory failure, shortness of Breath EXAM: PORTABLE CHEST 1 VIEW COMPARISON:  02/29/2016 FINDINGS: Cardiomegaly with changes of prior CABG. Diffuse bilateral airspace disease noted, likely pulmonary edema/ CHF. Focal left lower lobe consolidation concerning for pneumonia. No real change. IMPRESSION: Probable mild CHF. Focal left lower lobe opacity concerning for pneumonia. No change. Electronically Signed   By: Charlett Nose M.D.   On: 03/01/2016 07:38   Dg Chest Port 1 View  02/29/2016  CLINICAL DATA:  Pneumonia EXAM: PORTABLE CHEST 1 VIEW COMPARISON:  03/26/2016 FINDINGS: Patchy bilateral lung opacity. Density is greatest at the left base but there is chronic elevation of the diaphragm. No convincing change from prior. There is an element of superimposed pulmonary edema, especially given septal thickening seen on CT from yesterday. Chronic cardiomegaly, accentuated by elevated left diaphragm. Status post CABG. IMPRESSION: Bilateral pneumonia with superimposed edema, stable from yesterday. Electronically Signed   By: Marnee Spring M.D.   On: 02/29/2016 07:05   Dg Chest Port 1 View  02/28/2016  CLINICAL DATA:  Respiratory failure. EXAM: PORTABLE CHEST 1 VIEW COMPARISON:  02/28/2016 at 0803 hours FINDINGS: Sequelae of prior CABG are again identified. Cardiac silhouette remains enlarged. There is persistent elevation of the left hemidiaphragm. Diffuse bilateral interstitial opacities have mildly worsened, particularly in the right lung base. Left basilar opacity is similar to the prior study and likely reflects atelectasis. No sizable pleural effusion or pneumothorax is identified. Degenerative changes are noted about the shoulders. IMPRESSION: Mild worsening of bilateral interstitial opacities which may reflect edema or atypical infection. Electronically  Signed   By: Sebastian Ache M.D.   On: 02/28/2016 13:27   Personally viewed.   Telemetry: Atrial flutter rate controlled. Personally viewed.   EKG:  Atrial flutter rate 80, left bundle branch block Personally viewed.  Cardiac Studies:  Echo performed on 03/01/16-not yet read.  Meds: Scheduled Meds: . antiseptic oral rinse  7 mL Mouth Rinse BID  . aspirin  325 mg Oral Daily  . atorvastatin  40 mg Oral q1800  . azithromycin  500 mg Intravenous Q24H  . carvedilol  6.25 mg Oral BID  . cefTRIAXone (ROCEPHIN)  IV  1 g Intravenous Q24H  . enoxaparin (LOVENOX) injection  80 mg Subcutaneous Q12H  . enoxaparin (LOVENOX) injection  80 mg Subcutaneous Once  . FLUoxetine  40 mg Oral Daily  . furosemide  40 mg Oral BID  . ipratropium-albuterol  3 mL Nebulization TID PC & HS  . memantine  10 mg Oral BID  . potassium & sodium phosphates  1 packet Oral TID WC & HS   Continuous Infusions:  PRN Meds:.guaiFENesin  Assessment/Plan:  Principal Problem:   Acute respiratory distress (HCC) Active Problems:   CAP (community acquired pneumonia)   Atrial flutter (HCC)   Left bundle branch block   Hypoxia   FTT (failure to thrive) in adult   Acute CHF (congestive heart failure) (HCC)   Respiratory failure (HCC)  82 year old with new onset atrial fibrillation/flutter, pneumonia, coronary artery disease status post bypass in 2002, dementia, fever.  Atrial flutter -Score is at least 4 -Rate controlled -Previous note stated, need to address long-term anticoagulation. Given his advanced dementia and difficulty with ambulation he may not be a very good candidate. -Currently on aspirin and beta blocker for rate control. On Lovenox. -  If NOAC is started, recommend stopping aspirin. Will give a call to Dr. SwazilandJordan and discuss. I had long talk with family in room. With his dementia, recent falls at home within the past month I would prefer no anticoagulation long term as the risk will likely outweigh the benefit.  Family understands.   Coronary artery disease -Remote CABG 2002. No chest pain, troponin normal. Stable. -Aspirin, statin, beta blocker  Acute diastolic heart failure -IV Lasix. BNP only minimally elevated 121. Will give one more dose.   Hyperlipidemia -Statin.  Dementia -Per primary team  Community-acquired pneumonia -Antibiotic per primary team. Left lower lobe.  Hepatic dysfunction -Total bilirubin 2.6, lactic acid 2.3 -Per critical care team,. Fluids administered.  Donato SchultzMark Aurore Redinger 03/01/2016, 8:59 AM

## 2016-03-01 NOTE — Care Management Important Message (Signed)
Important Message  Patient Details  Name: Steven Yang MRN: [id number] Date of Birth: [birth year]   Medicare Important Message Given:  Yes    Kyla BalzarineShealy, Izaiha Lo Abena 03/01/2016, 11:49 AM

## 2016-03-01 NOTE — Significant Event (Signed)
Patient transferred safely to 3E14, traveled via wheelchair. VS stable prior and during the transfer. Patient has all his belongings with him (top denture which he is wearing) and clothes which his son has. Report given to receiving RN Tiffany. Family aware of transfer and is at bedside. Patient's settled in bed, alarm on. Staff in room prior to RN leaving.

## 2016-03-01 NOTE — Significant Event (Signed)
Found top denture at bedside and placed on patient. Unable to locate lower denture-patient's son is at bedside, made him aware. Patient's son call patient's spouse to inquire about the lower denture. Per patient's son, his mother has the patient's other denture.

## 2016-03-02 ENCOUNTER — Inpatient Hospital Stay (HOSPITAL_COMMUNITY): Payer: Non-veteran care

## 2016-03-02 DIAGNOSIS — R0902 Hypoxemia: Secondary | ICD-10-CM | POA: Insufficient documentation

## 2016-03-02 DIAGNOSIS — E876 Hypokalemia: Secondary | ICD-10-CM

## 2016-03-02 DIAGNOSIS — R5381 Other malaise: Secondary | ICD-10-CM | POA: Insufficient documentation

## 2016-03-02 DIAGNOSIS — I4892 Unspecified atrial flutter: Secondary | ICD-10-CM

## 2016-03-02 LAB — BASIC METABOLIC PANEL
ANION GAP: 11 (ref 5–15)
BUN: 12 mg/dL (ref 6–20)
CHLORIDE: 103 mmol/L (ref 101–111)
CO2: 25 mmol/L (ref 22–32)
CREATININE: 0.8 mg/dL (ref 0.61–1.24)
Calcium: 9.2 mg/dL (ref 8.9–10.3)
GFR calc Af Amer: >60 mL/min (ref >60)
GLUCOSE: 105 mg/dL — AB (ref 65–99)
POTASSIUM: 3.4 mmol/L — AB (ref 3.5–5.1)
Sodium: 139 mmol/L (ref 135–145)

## 2016-03-02 LAB — ECHOCARDIOGRAM COMPLETE
HEIGHTINCHES: 69 in
WEIGHTICAEL: 2972.8 [oz_av]

## 2016-03-02 LAB — TSH: TSH: 1.062 u[IU]/mL (ref 0.350–4.500)

## 2016-03-02 LAB — PHOSPHORUS: PHOSPHORUS: 2.5 mg/dL (ref 2.5–4.6)

## 2016-03-02 MED ORDER — ALBUTEROL SULFATE (2.5 MG/3ML) 0.083% IN NEBU
2.5000 mg | INHALATION_SOLUTION | RESPIRATORY_TRACT | Status: DC | PRN
  Administered 2016-03-03: 2.5 mg via RESPIRATORY_TRACT
  Filled 2016-03-02: qty 3

## 2016-03-02 MED ORDER — ALBUTEROL SULFATE (2.5 MG/3ML) 0.083% IN NEBU
INHALATION_SOLUTION | RESPIRATORY_TRACT | Status: AC
  Administered 2016-03-02: 2.5 mg
  Filled 2016-03-02: qty 3

## 2016-03-02 MED ORDER — AZITHROMYCIN 500 MG PO TABS
500.0000 mg | ORAL_TABLET | Freq: Every day | ORAL | Status: DC
Start: 2016-03-02 — End: 2016-03-04
  Administered 2016-03-02 – 2016-03-04 (×3): 500 mg via ORAL
  Filled 2016-03-02 (×3): qty 1

## 2016-03-02 MED ORDER — PERFLUTREN LIPID MICROSPHERE
1.0000 mL | INTRAVENOUS | Status: AC | PRN
  Administered 2016-03-02: 2 mL via INTRAVENOUS
  Filled 2016-03-02: qty 10

## 2016-03-02 MED ORDER — CEFPODOXIME PROXETIL 200 MG PO TABS
200.0000 mg | ORAL_TABLET | Freq: Two times a day (BID) | ORAL | Status: DC
  Administered 2016-03-02 – 2016-03-04 (×5): 200 mg via ORAL
  Filled 2016-03-02 (×5): qty 1

## 2016-03-02 NOTE — Progress Notes (Signed)
PROGRESS NOTE  Steven Yang:[id number] DOB: Apr 16, 1922 DOA: 02/28/2016 PCP: Ezequiel Kayser, MD  HPI/Recap of past 24 hours: 82 yo with cad and reported purulent sputum X 48 HRS who has required NIMVS in ED and c x r may demonstrate pna, on treatment for CAP.  He has a history of Fib/Flutter and was in A flutter on presentation. Currently resting comfortably, denies chest pain or SOB at rest. Just walked with PT.   Assessment/Plan: Principal Problem:   Acute respiratory distress (HCC) - due to CAP and poss Aflutter - cont abx azithromycin and rocephin Active Problems:   CAP (community acquired pneumonia) - as above   Atrial flutter (HCC) - on lovenox in hospital, but likely a poor AC candidate. Cards has discussed this with family.   Left bundle branch block   Hypoxia   FTT (failure to thrive) in adult   Acute CHF (congestive heart failure) (HCC)   Respiratory failure (HCC)   Code Status: FULL   Family Communication: None present this AM   Disposition Plan: Likely will need SNF per PT.    Consultants:  cards   Procedures:  None    Antimicrobials:  Azithro, rocephin.    Objective: Filed Vitals:   03/01/16 2115 03/02/16 0520 03/02/16 0810 03/02/16 0850  BP: 143/87 146/68  148/74  Pulse:  88  88  Temp:  98.7 F (37.1 C)  98.6 F (37 C)  TempSrc:  Oral  Oral  Resp:  22    Height:      Weight:  84.278 kg (185 lb 12.8 oz)    SpO2:  96% 94%     Intake/Output Summary (Last 24 hours) at 03/02/16 1007 Last data filed at 03/02/16 0944  Gross per 24 hour  Intake    660 ml  Output    570 ml  Net     90 ml   Filed Weights   02/28/16 0804 02/28/16 1730 03/02/16 0520  Weight: 87.544 kg (193 lb) 86.2 kg (190 lb 0.6 oz) 84.278 kg (185 lb 12.8 oz)    Exam: General:  Alert, oriented, calm, in no acute distress, resting comfortably in chair Eyes: pupils round and reactive to light and accomodation, clear sclerea Neck: supple, no masses, trachea mildline    Cardiovascular: RR, rate 93, no murmurs or rubs, no peripheral edema  Respiratory: no wheezes, slightly tachypneic, crackles at B bases Abdomen: soft, nontender, nondistended, normal bowel tones heard  Skin: dry, no rashes  Musculoskeletal: no joint effusions, normal range of motion  Psychiatric: appropriate affect, normal speech  Neurologic: extraocular muscles intact, clear speech, moving all extremities with intact sensorium    Data Reviewed: CBC:  Recent Labs Lab 02/28/16 0737 02/29/16 0316 03/01/16 0710  WBC 15.2* 11.8* 9.9  NEUTROABS 13.2*  --   --   HGB 12.8* 12.0* 11.2*  HCT 38.9* 35.8* 33.7*  MCV 94.2 93.0 92.8  PLT 206 164 157   Basic Metabolic Panel:  Recent Labs Lab 02/28/16 0737 02/29/16 0316 03/01/16 0710 03/02/16 0511  NA 138 139 138 139  K 3.7 3.1* 3.5 3.4*  CL 106 106 106 103  CO2 GLUCOSE 153* 98 104* 105*  BUN CREATININE 0.97 0.93 0.78 0.80  CALCIUM 9.2 8.7* 8.8* 9.2  MG  --   --  1.7  --   PHOS  --   --  1.6* 2.5   GFR: Estimated Creatinine Clearance: 58.9 mL/min (by  C-G formula based on Cr of 0.8). Liver Function Tests:  Recent Labs Lab 02/28/16 0737 02/29/16 0316  AST 39 46*  ALT 22 35  ALKPHOS 64 68  BILITOT 2.3* 2.6*  PROT 6.5 6.2*  ALBUMIN 3.4* 2.8*   No results for input(s): LIPASE, AMYLASE in the last 168 hours. No results for input(s): AMMONIA in the last 168 hours. Coagulation Profile:  Recent Labs Lab 02/28/16 1150  INR 1.08   Cardiac Enzymes: No results for input(s): CKTOTAL, CKMB, CKMBINDEX, TROPONINI in the last 168 hours. BNP (last 3 results) No results for input(s): PROBNP in the last 8760 hours. HbA1C: No results for input(s): HGBA1C in the last 72 hours. CBG: No results for input(s): GLUCAP in the last 168 hours. Lipid Profile: No results for input(s): CHOL, HDL, LDLCALC, TRIG, CHOLHDL, LDLDIRECT in the last 72 hours. Thyroid Function Tests:  Recent Labs  03/02/16 0511  TSH  1.062   Anemia Panel: No results for input(s): VITAMINB12, FOLATE, FERRITIN, TIBC, IRON, RETICCTPCT in the last 72 hours. Urine analysis:    Component Value Date/Time   COLORURINE AMBER* 02/28/2016 1636   APPEARANCEUR CLEAR 02/28/2016 1636   LABSPEC 1.020 02/28/2016 1636   PHURINE 5.5 02/28/2016 1636   GLUCOSEU 100* 02/28/2016 1636   HGBUR MODERATE* 02/28/2016 1636   BILIRUBINUR NEGATIVE 02/28/2016 1636   KETONESUR 15* 02/28/2016 1636   PROTEINUR 100* 02/28/2016 1636   UROBILINOGEN 0.2 03/13/2009 1549   NITRITE NEGATIVE 02/28/2016 1636   LEUKOCYTESUR NEGATIVE 02/28/2016 1636   Sepsis Labs: @LABRCNTIP (procalcitonin:4,lacticidven:4)  ) Recent Results (from the past 240 hour(s))  Culture, blood (Routine x 2)     Status: None (Preliminary result)   Collection Time: 02/28/16  7:37 AM  Result Value Ref Range Status   Specimen Description BLOOD LEFT ANTECUBITAL  Final   Special Requests BOTTLES DRAWN AEROBIC AND ANAEROBIC  5CC  Final   Culture NO GROWTH 2 DAYS  Final   Report Status PENDING  Incomplete  Culture, blood (Routine x 2)     Status: None (Preliminary result)   Collection Time: 02/28/16  8:15 AM  Result Value Ref Range Status   Specimen Description BLOOD LEFT WRIST  Final   Special Requests BOTTLES DRAWN AEROBIC AND ANAEROBIC  5CC  Final   Culture NO GROWTH 2 DAYS  Final   Report Status PENDING  Incomplete  Urine culture     Status: Abnormal   Collection Time: 02/28/16  4:37 PM  Result Value Ref Range Status   Specimen Description URINE, CLEAN CATCH  Final   Special Requests NONE  Final   Culture MULTIPLE SPECIES PRESENT, SUGGEST RECOLLECTION (A)  Final   Report Status 03/01/2016 FINAL  Final  MRSA PCR Screening     Status: None   Collection Time: 02/28/16  4:46 PM  Result Value Ref Range Status   MRSA by PCR NEGATIVE NEGATIVE Final    Comment:        The GeneXpert MRSA Assay (FDA approved for NASAL specimens only), is one component of a comprehensive MRSA  colonization surveillance program. It is not intended to diagnose MRSA infection nor to guide or monitor treatment for MRSA infections.       Studies: No results found.  Scheduled Meds: . aspirin  325 mg Oral Daily  . atorvastatin  40 mg Oral q1800  . azithromycin  500 mg Intravenous Q24H  . carvedilol  6.25 mg Oral BID  . cefTRIAXone (ROCEPHIN)  IV  1 g Intravenous Q24H  .  enoxaparin (LOVENOX) injection  80 mg Subcutaneous Q12H  . FLUoxetine  40 mg Oral Daily  . furosemide  40 mg Oral BID  . ipratropium-albuterol  3 mL Nebulization TID  . memantine  10 mg Oral BID    Continuous Infusions:    LOS: 3 days   Time spent: 28 minutes  Lateasha Breuer Vergie LivingMohammed Marlee Trentman, MD [facility] Pager [phone]  If 7PM-7AM, please contact night-coverage [email or web address] Password [email or web address] 03/02/2016, 10:07 AM

## 2016-03-02 NOTE — Evaluation (Signed)
Occupational Therapy Evaluation Patient Details Name: Steven Yang MRN: [id number] DOB: [birth year] Today's Date: 03/02/2016    History of Present Illness 82 yo with cad and reported purulent sputum X 48 HRS, and cxr demonstrates multilobar pna; Dementia, Total hip 2002, CABG   Clinical Impression   Limited evaluation due to pt wanting to sleep. No family available to offer information regarding pt's PLOF in ADL. Was using a walker and has a hx of falls. Pt needing encouragement, but participated in self feeding. Will follow acutely.    Follow Up Recommendations  SNF;Supervision/Assistance - 24 hour    Equipment Recommendations       Recommendations for Other Services       Precautions / Restrictions Precautions Precautions: Fall Restrictions Weight Bearing Restrictions: No      Mobility Bed Mobility   Bed Mobility: Rolling;Sidelying to Sit Rolling: Supervision Sidelying to sit: Max assist       General bed mobility comments: pt actively resisting sitting EOB, +2 total assist to pull pt up in bed for medications to be administered  Transfers                 General transfer comment: pt declined, stating he wanted to sleep    Balance                                            ADL Overall ADL's : Needs assistance/impaired Eating/Feeding: Supervision/ safety;Bed level Eating/Feeding Details (indicate cue type and reason): prefers someone to feed him, but demonstrated ability with encouragement                                  General ADL Comments: Pt resists, prefers to have assist with ADL.     Vision Additional Comments: Pt with impaired perception of objects/people in room.   Perception     Praxis      Pertinent Vitals/Pain Pain Assessment: Faces Faces Pain Scale: No hurt     Hand Dominance Right   Extremity/Trunk Assessment Upper Extremity Assessment Upper Extremity Assessment: Overall WFL for tasks  assessed   Lower Extremity Assessment Lower Extremity Assessment: Defer to PT evaluation       Communication Communication Communication: No difficulties   Cognition Arousal/Alertness: Awake/alert Behavior During Therapy: WFL for tasks assessed/performed;Impulsive Overall Cognitive Status: No family/caregiver present to determine baseline cognitive functioning                     General Comments       Exercises       Shoulder Instructions      Home Living Family/patient expects to be discharged to:: Skilled nursing facility Living Arrangements: Spouse/significant other                                      Prior Functioning/Environment Level of Independence: Independent with assistive device(s)        Comments: RW full time; wife has been having increasing difficulty caring for him and her disabled son    OT Diagnosis: Generalized weakness;Cognitive deficits;Disturbance of vision   OT Problem List: Decreased strength;Decreased activity tolerance;Impaired balance (sitting and/or standing);Decreased cognition;Decreased safety awareness;Decreased knowledge of use of DME or AE   OT  Treatment/Interventions: Self-care/ADL training;DME and/or AE instruction;Patient/family education;Balance training    OT Goals(Current goals can be found in the care plan section) Acute Rehab OT Goals Patient Stated Goal: to sleep OT Goal Formulation: Patient unable to participate in goal setting Time For Goal Achievement: 03/09/16 Potential to Achieve Goals: Fair ADL Goals Pt Will Perform Eating: with supervision;sitting Pt Will Perform Grooming: with min assist;sitting Pt Will Perform Upper Body Bathing: with min assist;sitting Pt Will Perform Upper Body Dressing: with min assist;sitting Pt Will Transfer to Toilet: with min guard assist;ambulating  OT Frequency: Min 2X/week   Barriers to D/C: Decreased caregiver support          Co-evaluation               End of Session    Activity Tolerance: Patient limited by fatigue Patient left: in bed;with call bell/phone within reach;with bed alarm set;with nursing/sitter in room   Time: 1525-1539 OT Time Calculation (min): 14 min Charges:  OT General Charges $OT Visit: 1 Procedure OT Evaluation $OT Eval Moderate Complexity: 1 Procedure G-Codes:    Evern BioMayberry, Dametria Tuzzolino Lynn 03/02/2016, 4:12 PM  [phone]

## 2016-03-02 NOTE — Progress Notes (Signed)
Physical Therapy Treatment Patient Details Name: Steven Yang MRN: [id number] DOB: [birth year] Today's Date: 03/02/2016    History of Present Illness 82 yo with cad and reported purulent sputum X 48 HRS, and cxr demonstrates multilobar pna; Dementia, Total hip 2002, CABG    PT Comments    Patient HOH and ?decreased attention to instructions. Poorly using RW with ambulation (max cues and physical assist to keep pt from tripping on back legs of RW). ? With repetition will be able to use RW as an asset. Decreased SaO2 to 86% on room air. (Unable to get reading while walking; once seated required 2 minutes to recover >90% on 2L; at 93% at end of session).    Follow Up Recommendations  SNF     Equipment Recommendations  Other (comment) (continue to assess--currently unsafe with RW)    Recommendations for Other Services       Precautions / Restrictions Precautions Precautions: Fall    Mobility  Bed Mobility Overal bed mobility: Needs Assistance Bed Mobility: Supine to Sit     Supine to sit: Min assist     General bed mobility comments: +used rail; incr time and effort  Transfers Overall transfer level: Needs assistance Equipment used: Rolling walker (2 wheeled) Transfers: Sit to/from Stand Sit to Stand: Min assist         General transfer comment: repeated vc for safe sequencing with RW  Ambulation/Gait Ambulation/Gait assistance: Mod assist;+2 safety/equipment Ambulation Distance (Feet): 90 Feet Assistive device: Rolling walker (2 wheeled) Gait Pattern/deviations: Step-through pattern;Decreased stride length;Shuffle;Trunk flexed Gait velocity: too fast for his respiratory status and for safe use of RW   General Gait Details: Cues to self-monitor for activity tolerance, for posture, and for RW proximity (requires constant cues and assist to keep from tripping over back legs of RW--esp with turning)   Stairs            Wheelchair Mobility    Modified  Rankin (Stroke Patients Only)       Balance Overall balance assessment: Needs assistance Sitting-balance support: No upper extremity supported;Feet supported Sitting balance-Leahy Scale: Fair     Standing balance support: No upper extremity supported Standing balance-Leahy Scale: Poor Standing balance comment: only briefly feels comfortable without UE support                    Cognition Arousal/Alertness: Awake/alert Behavior During Therapy: WFL for tasks assessed/performed;Impulsive Overall Cognitive Status: No family/caregiver present to determine baseline cognitive functioning                      Exercises      General Comments        Pertinent Vitals/Pain Pain Assessment: No/denies pain    Home Living                      Prior Function            PT Goals (current goals can now be found in the care plan section) Acute Rehab PT Goals Patient Stated Goal: Am I going home today? Time For Goal Achievement: 03/14/16 Progress towards PT goals: Progressing toward goals    Frequency  Min 2X/week    PT Plan Current plan remains appropriate    Co-evaluation             End of Session Equipment Utilized During Treatment: Gait belt;Oxygen Activity Tolerance: Treatment limited secondary to medical complications (Comment);Patient limited by fatigue (decr SaO2)  Patient left: in chair;with call bell/phone within reach;with chair alarm set     Time: [id number] PT Time Calculation (min) (ACUTE ONLY): 21 min  Charges:  $Gait Training: 8-22 mins                    G Codes:      Jyrah Blye 03/02/2016, 10:15 AM Pager [phone]

## 2016-03-02 NOTE — Progress Notes (Signed)
Echocardiogram 2D Echocardiogram has been performed.  Steven Yang 03/02/2016, 2:49 PM

## 2016-03-02 NOTE — Progress Notes (Signed)
PULMONARY / CRITICAL CARE MEDICINE   Name: Steven Yang MRN: [id number] DOB: Feb 05, 1922    ADMISSION DATE:  02/28/2016   REFERRING MD:  EDP  CHIEF COMPLAINT: Cough  HISTORY OF PRESENT ILLNESS:   82 yo with cad and reported purulent sputum X 48 HRS who has required NIMVS in ED and c x r may demonstrate pna. He has a history of Fib/Flutter and was in A flutter on presentation. Treated for CAP.   SUBJECTIVE:  Confused. SOB w/ activity  VITAL SIGNS: BP 115/76 mmHg  Pulse 85  Temp(Src) 98.5 F (36.9 C) (Oral)  Resp 20  Ht  (1.753 m)  Wt 185 lb 12.8 oz (84.278 kg)  BMI 27.43 kg/m2  SpO2 96% 2 liters  HEMODYNAMICS:    VENTILATOR SETTINGS:    INTAKE / OUTPUT: I/O last 3 completed shifts: In: 540 [P.O.:240; IV Piggyback:300] Out: 695 [Urine:695]  PHYSICAL EXAMINATION: General:  EWM NAD at rest in chair Neuro:  Confused as to time and place but alert and interactive HEENT:  No JVD/LAN Cardiovascular: RRR, 90's, no M Lungs: scattered rhonchi, still w/ accessory use after exertion Abdomen:  Soft + bs Musculoskeletal:  Intact Skin:  Warm/dry/no edema  LABS:  BMET  Recent Labs Lab 02/29/16 0316 03/01/16 0710 03/02/16 0511  NA 139 138 139  K 3.1* 3.5 3.4*  CL 106 106 103  CO2 BUN CREATININE 0.93 0.78 0.80  GLUCOSE 98 104* 105*    Electrolytes  Recent Labs Lab 02/29/16 0316 03/01/16 0710 03/02/16 0511  CALCIUM 8.7* 8.8* 9.2  MG  --  1.7  --   PHOS  --  1.6* 2.5    CBC  Recent Labs Lab 02/28/16 0737 02/29/16 0316 03/01/16 0710  WBC 15.2* 11.8* 9.9  HGB 12.8* 12.0* 11.2*  HCT 38.9* 35.8* 33.7*  PLT 206 164 157    Coag's  Recent Labs Lab 02/28/16 1150  INR 1.08    Sepsis Markers  Recent Labs Lab 02/28/16 0837  02/28/16 1415 02/28/16 1807 02/29/16 1415  LATICACIDVEN  --   < > 1.8 2.0 2.3*  PROCALCITON 0.29  --   --   --   --   < > = values in this interval not displayed.  ABG  Recent Labs Lab  02/28/16 0828 02/28/16 1314 02/29/16 0626  PHART 7.379 7.371 7.399  PCO2ART 39.4 32.6* 33.7*  PO2ART 109.0* 70.0* 64.0*    Liver Enzymes  Recent Labs Lab 02/28/16 0737 02/29/16 0316  AST 39 46*  ALT 22 35  ALKPHOS 64 68  BILITOT 2.3* 2.6*  ALBUMIN 3.4* 2.8*    Cardiac Enzymes No results for input(s): TROPONINI, PROBNP in the last 168 hours.  Glucose No results for input(s): GLUCAP in the last 168 hours.  Imaging No results found.   STUDIES:  6/3 TTE >>  6/3 cta chest>> LLL pna, GGO noted, ?edema  CULTURES: 6/3 bc x 2>> 6/3 uc>>  ANTIBIOTICS: 6/3 ceftriaxone >> 6/3 azithro >>  SIGNIFICANT EVENTS: 6/3 required nimvs 6/4 confused overnight with some agitation.  6/5 required safety sitter o/n  LINES/TUBES:  DISCUSSION: 82 yo with cad and reported purulent sputum X 48 HRS who has required NIMVS in ED and cxr  demonstrate pna +/- element of edema. He has a history of Fib/Flutter. Treated for CAP, diuresed, rate controlled, and slowly improving. Will change to oral abx, cont current diuresis, out to SNF when medically ready   ASSESSMENT /  PLAN:  Hypoxia LLL CAP  O2 as needed Abx: complete 7d for CAP total BD's scheduled pulm hygiene  CAD post CABG x 4 in 2002 Suspected volume overload PAF/Flutter Cards consult noted Would dc therapeutic heparin Coreg, ASA, lipitor Echocardiogram pending  Gentle diuresis as tolerated  Hypokalemia  Follow BMP Replete electrolytes as indicated    Dementia complicated by delirium Frequent orientation Minimize sedation namenda scheduled prozac  Deconditioning For SNF placement   Simonne MartinetPeter E Babcock ACNP-BC [facility] Pager # [phone] OR # [phone] if no answer  Attending Note:  82 year old male with PMH above presenting with LLL CAP, CAD and severe deconditioning. Patient is slowly improving, on exam bibasilar crackles.  I reviewed CXR myself, LLL infiltrate noted.  Discussed with  PCCM-NP.  LLL infiltrate:  - Abx as above.  - F/u on culture.  Hypoxemia:  - Titrate o2 for sat of 88-92%.  - Will need an ambulatory desat study for ? Of home O2.  Hypokalemia:  - Replace K.  - BMET in AM.  Deconditioning:  - PT evaluation.  - Will need SNF placement.  PCCM will sign off, please call back if needed.  Patient seen and examined, agree with above note.  I dictated the care and orders written for this patient under my direction.  Alyson ReedyWesam G Yacoub, MD [phone]

## 2016-03-02 NOTE — Progress Notes (Signed)
Cardiologist: Dr. SwazilandJordan Subjective:  Some shortness of breath no chest pain  Presented with shortness of breath, fever 101.1.  Objective:  Vital Signs in the last 24 hours: Temp:  [98.5 F (36.9 C)-98.7 F (37.1 C)] 98.5 F (36.9 C) (06/06 1229) Pulse Rate:  [85-88] 85 (06/06 1229) Resp:  [20-22] 20 (06/06 1229) BP: (115-148)/(68-87) 115/76 mmHg (06/06 1229) SpO2:  [91 %-96 %] 91 % (06/06 1451) Weight:  [185 lb 12.8 oz (84.278 kg)] 185 lb 12.8 oz (84.278 kg) (06/06 0520)  Intake/Output from previous day: 06/05 0701 - 06/06 0700 In: 540 [P.O.:240; IV Piggyback:300] Out: 570 [Urine:570]   Physical Exam: General: Well developed, well nourished, in no acute distress. Elderly Head:  Normocephalic and atraumatic. Lungs: Mild rhonchi throughout Heart: Normal S1 and S2.  No murmur, rubs or gallops.  Abdomen: soft, non-tender, positive bowel sounds. Extremities: No clubbing or cyanosis. No edema. Neurologic: Alert and oriented x 3.    Lab Results:  Recent Labs  02/29/16 0316 03/01/16 0710  WBC 11.8* 9.9  HGB 12.0* 11.2*  PLT 164 157    Recent Labs  03/01/16 0710 03/02/16 0511  NA 138 139  K 3.5 3.4*  CL 106 103  CO2 24 25  GLUCOSE 104* 105*  BUN 11 12  CREATININE 0.78 0.80   No results for input(s): TROPONINI in the last 72 hours.  Invalid input(s): CK, MB Hepatic Function Panel  Recent Labs  02/29/16 0316  PROT 6.2*  ALBUMIN 2.8*  AST 46*  ALT 35  ALKPHOS 68  BILITOT 2.6*   No results for input(s): CHOL in the last 72 hours. No results for input(s): PROTIME in the last 72 hours.  Imaging: Dg Chest Port 1 View  03/01/2016  CLINICAL DATA:  Respiratory failure, shortness of Breath EXAM: PORTABLE CHEST 1 VIEW COMPARISON:  02/29/2016 FINDINGS: Cardiomegaly with changes of prior CABG. Diffuse bilateral airspace disease noted, likely pulmonary edema/ CHF. Focal left lower lobe consolidation concerning for pneumonia. No real change. IMPRESSION:  Probable mild CHF. Focal left lower lobe opacity concerning for pneumonia. No change. Electronically Signed   By: Charlett NoseKevin  Dover M.D.   On: 03/01/2016 07:38   Personally viewed.   Telemetry: Atrial flutter rate controlled. Personally viewed.   EKG:  Atrial flutter rate 80, left bundle branch block Personally viewed.  Cardiac Studies:  Echo 03/02/16 - Left ventricle: Abnormal septal motion The cavity size was  normal. Wall thickness was normal. Systolic function was normal.  The estimated ejection fraction was in the range of 50% to 55%.  Left ventricular diastolic function parameters were normal. - Mitral valve: Calcified annulus.  Meds: Scheduled Meds: . aspirin  325 mg Oral Daily  . atorvastatin  40 mg Oral q1800  . azithromycin  500 mg Oral Daily  . carvedilol  6.25 mg Oral BID  . cefpodoxime  200 mg Oral Q12H  . enoxaparin (LOVENOX) injection  80 mg Subcutaneous Q12H  . FLUoxetine  40 mg Oral Daily  . furosemide  40 mg Oral BID  . ipratropium-albuterol  3 mL Nebulization TID  . memantine  10 mg Oral BID   Continuous Infusions:  PRN Meds:.albuterol, guaiFENesin, perflutren lipid microspheres (DEFINITY) IV suspension  Assessment/Plan:  Principal Problem:   Acute respiratory distress (HCC) Active Problems:   CAP (community acquired pneumonia)   Atrial flutter (HCC)   Left bundle branch block   Hypoxia   FTT (failure to thrive) in adult   Acute CHF (congestive heart failure) (HCC)  Respiratory failure (HCC)  82 year old with new onset atrial fibrillation/flutter, pneumonia, coronary artery disease status post bypass in 2002, dementia, fever.  Atrial flutter -Score is at least 4 -Rate controlled -Previous note stated, need to address long-term anticoagulation. Given his advanced dementia and difficulty with ambulation he may not be a very good candidate. -Currently on aspirin and beta blocker for rate control.  -Discussed with Dr. Swaz[place]. I had long talk with family  in room. With his dementia, recent falls at home within the past month I would prefer no anticoagulation long term as the risk will likely outweigh the benefit. Family understands. Dr. Swaziland agrees.Understands risk of stroke.  Coronary artery disease -Remote CABG 2002. No chest pain, troponin normal. Stable. -Aspirin, statin, beta blocker - Echo reassuring. Normal EF.  Acute diastolic heart failure -PO Lasix. BNP only minimally elevated 121. Replete potassium per primary team.  Hyperlipidemia -Statin.  Dementia -Per primary team  Community-acquired pneumonia -Antibiotic per primary team. Left lower lobe.  Hepatic dysfunction -Total bilirubin 2.6, lactic acid 2.3 previously -Per critical care team  We will go ahead and sign off. Please call us if any questions or needed.  Donato Schultz 03/02/2016, 3:25 PM

## 2016-03-02 NOTE — Progress Notes (Addendum)
ANTICOAGULATION CONSULT NOTE Pharmacy Consult for Enoxaparin Indication: atrial fibrillation  Allergies  Allergen Reactions  . Aricept [Donepezil Hydrochloride] Other (See Comments)    Unknown reaction per pt's family    Labs:  Recent Labs  02/28/16 1150 02/29/16 0316 03/01/16 0710 03/02/16 0511  HGB  --  12.0* 11.2*  --   HCT  --  35.8* 33.7*  --   PLT  --  164 157  --   LABPROT 14.2  --   --   --   INR 1.08  --   --   --   CREATININE  --  0.93 0.78 0.80    Estimated Creatinine Clearance: 58.9 mL/min (by C-G formula based on Cr of 0.8).  Assessment: 82 yo M presents on 6/3 with SOB. Found to be in Afib.  Not planning long term anti-coagulation Scr stable CBC stable  Goal of Therapy:  Monitor platelets by anticoagulation protocol: Yes   Plan: Continue  enoxaparin 80mg  [place] Q 12 hours Continue to follow  Consider decreasing fluoxetine in 82 year old male from 6040 to 20 mg for now (very long half-life in the elderly)  Thank you Okey RegalLisa Leida Luton, PharmD [phone]  03/02/2016 10:33 AM

## 2016-03-02 NOTE — Clinical Social Work Note (Signed)
[name]. Patient's family aware and accepted bed offer. Insurance [name] will be started today. Patient will need PTAR.  Steven CourtSarah Jamani Eley, CSW [phone]

## 2016-03-03 ENCOUNTER — Ambulatory Visit: Payer: Medicare HMO | Admitting: Podiatry

## 2016-03-03 DIAGNOSIS — I5031 Acute diastolic (congestive) heart failure: Secondary | ICD-10-CM

## 2016-03-03 LAB — BASIC METABOLIC PANEL
Anion gap: 10 (ref 5–15)
BUN: 10 mg/dL (ref 6–20)
CALCIUM: 8.8 mg/dL — AB (ref 8.9–10.3)
CHLORIDE: 102 mmol/L (ref 101–111)
CO2: 28 mmol/L (ref 22–32)
CREATININE: 0.86 mg/dL (ref 0.61–1.24)
GFR calc Af Amer: >60 mL/min (ref >60)
GFR calc non Af Amer: >60 mL/min (ref >60)
GLUCOSE: 99 mg/dL (ref 65–99)
Potassium: 2.8 mmol/L — ABNORMAL LOW (ref 3.5–5.1)
Sodium: 140 mmol/L (ref 135–145)

## 2016-03-03 LAB — CBC
HEMATOCRIT: 33.3 % — AB (ref 39.0–52.0)
HEMOGLOBIN: 10.9 g/dL — AB (ref 13.0–17.0)
MCH: 30.4 pg (ref 26.0–34.0)
MCHC: 32.7 g/dL (ref 30.0–36.0)
MCV: 93 fL (ref 78.0–100.0)
Platelets: 178 10*3/uL (ref 150–400)
RBC: 3.58 MIL/uL — ABNORMAL LOW (ref 4.22–5.81)
RDW: 13.8 % (ref 11.5–15.5)
WBC: 6.9 10*3/uL (ref 4.0–10.5)

## 2016-03-03 MED ORDER — POTASSIUM CHLORIDE CRYS ER 20 MEQ PO TBCR
40.0000 meq | EXTENDED_RELEASE_TABLET | ORAL | Status: AC
  Administered 2016-03-03 (×2): 40 meq via ORAL
  Filled 2016-03-03 (×2): qty 2

## 2016-03-03 MED ORDER — ENOXAPARIN SODIUM 40 MG/0.4ML ~~LOC~~ SOLN
40.0000 mg | SUBCUTANEOUS | Status: DC
  Administered 2016-03-03: 40 mg via SUBCUTANEOUS
  Filled 2016-03-03: qty 0.4

## 2016-03-03 MED ORDER — FUROSEMIDE 40 MG PO TABS: 40.0000 mg | ORAL_TABLET | Freq: Two times a day (BID) | ORAL | Status: AC

## 2016-03-03 MED ORDER — AZITHROMYCIN 500 MG PO TABS: 500.0000 mg | ORAL_TABLET | Freq: Every day | ORAL | Status: AC

## 2016-03-03 MED ORDER — POTASSIUM CHLORIDE CRYS ER 20 MEQ PO TBCR: 40.0000 meq | EXTENDED_RELEASE_TABLET | ORAL | Status: DC

## 2016-03-03 MED ORDER — POTASSIUM CHLORIDE CRYS ER 20 MEQ PO TBCR: 40.0000 meq | EXTENDED_RELEASE_TABLET | Freq: Every day | ORAL | Status: AC

## 2016-03-03 MED ORDER — IPRATROPIUM-ALBUTEROL 0.5-2.5 (3) MG/3ML IN SOLN: 3.0000 mL | Freq: Four times a day (QID) | RESPIRATORY_TRACT | Status: AC | PRN

## 2016-03-03 MED ORDER — CEFPODOXIME PROXETIL 200 MG PO TABS: 200.0000 mg | ORAL_TABLET | Freq: Two times a day (BID) | ORAL | Status: AC

## 2016-03-03 NOTE — Discharge Instructions (Signed)

## 2016-03-03 NOTE — Discharge Summary (Addendum)
Physician Discharge Summary  Steven Yang:[id number] DOB: Feb 05, 1977 DOA: 02/28/2016  PCP: Ezequiel Kayser, MD  Admit date: 02/28/2016 Discharge date: 03/04/2016  Recommendations for Outpatient Follow-up:  1. Pt will need to follow up with PCP in 2-3 weeks post discharge 2. Please obtain BMP to evaluate electrolytes and kidney function, potassium level  3. Please also check CBC to evaluate Hg and Hct levels 4. Vantin for 2 more days 5. Zithromax for three more days post discharge  6. Called son Gerlene Burdock for an update, his questions were answered  7. Please monitor weight daily, weight on discharge 180 lbs  Discharge Diagnoses:  Principal Problem:   Acute respiratory distress (HCC) Active Problems:   CAP (community acquired pneumonia)  Discharge Condition: Stable  Diet recommendation: Heart healthy diet discussed in details   History of present illness:  82 yo with cad, presented reported purulent sputum X 48 HRS who has required NIMVS in ED and CXR demonstrated pna. He has a history of Fib/Flutter and was in A flutter on presentation. Treated for CAP under PCCM.  SIGNIFICANT EVENTS: 6/3 required nimvs 6/4 confused overnight with some agitation.  6/5 required safety sitter o/n 6/7 care transferred [facility] to [facility] Course:  Atrial flutter - Score is at least 4 - Rate controlled - given advanced dementia and difficulty with ambulation, it was determined that pt is not a good candidate for [facility] - Currently on aspirin and beta blocker for rate control.  - family and pt made aware of no AC and higher subsequent risk of stroke, agree with conservative management   Coronary artery disease - Remote CABG 2002. No chest pain, troponin normal. Stable. - Aspirin, statin, beta blocker - Echo reassuring. Normal EF.  Acute diastolic heart failure - continue with PO Lasix. BNP only minimally elevated 121  Hypokalemia - continue to supplement  Hyperlipidemia - continue  Statin.  Dementia - stable   Community-acquired pneumonia, LLL, unknown pathogen - complete ABX as noted above   Procedures/Studies: Ct Angio Chest Pe W/cm &/or Wo Cm 02/28/2016 Negative for significant acute central or proximal hilar pulmonary embolus. Limited assessment of the smaller peripheral branches to exclude small emboli. Multifocal patchy airspace process with dense central lingula and left lower lobe consolidation compatible with multifocal pneumonia. Superimposed patchy diffuse ground-glass opacities suggest a component of the edema as well. Chronic elevation of the left hemidiaphragm   Dg Chest Port 1 View 03/01/2016 Probable mild CHF. Focal left lower lobe opacity concerning for pneumonia. No change.   Dg Chest Port 1 View 02/29/2016   Bilateral pneumonia with superimposed edema, stable from yesterday  Dg Chest Port 1 View 02/28/2016 Mild worsening of bilateral interstitial opacities which may reflect edema or atypical infection.   Dg Chest Portable 1 View 02/28/2016  . New cardiomegaly with bilateral interstitial densities which may reflect mild edema. 2. Left hemidiaphragm elevation with likely left basilar atelectasis.   Discharge Exam: Filed Vitals:   03/02/16 1950 03/03/16 0423  BP: 150/81 158/79  Pulse: 84 87  Temp: 99.3 F (37.4 C) 98.5 F (36.9 C)  Resp: 18 18   Filed Vitals:   03/02/16 1950 03/02/16 2225 03/03/16 0423 03/03/16 0754  BP: 150/81  158/79   Pulse: 84  87   Temp: 99.3 F (37.4 C)  98.5 F (36.9 C)   TempSrc: Oral  Oral   Resp: 18  18   Height:      Weight:   84.006 kg (185 lb  3.2 oz)   SpO2: 98% 97% 97% 95%    General: Pt is alert, not in acute distress Cardiovascular: Iregular rate and rhythm, no rubs, no gallops Respiratory: scattered rales with diminished breath sounds at bases  Abdominal: Soft, non tender, non distended, bowel sounds +, no guarding Extremities: chronic bilateral venous stasis changes with +1 bilateral LE edema, pulses  palpable bilaterally DP and PT Neuro: Grossly nonfocal  Discharge Instructions  Discharge Instructions    Diet - low sodium heart healthy    Complete by:  As directed      Increase activity slowly    Complete by:  As directed             Medication List    STOP taking these medications        MUCINEX FAST-MAX CONGEST COUGH PO     polyethylene glycol packet  Commonly known as:  MIRALAX     senna-docusate 8.6-50 MG tablet  Commonly known as:  Senokot-S      TAKE these medications        aspirin 325 MG tablet  Take 325 mg by mouth daily.     azithromycin 500 MG tablet  Commonly known as:  ZITHROMAX  Take 1 tablet (500 mg total) by mouth daily.     carvedilol 25 MG tablet  Commonly known as:  COREG  Take 12.5 mg by mouth 2 (two) times daily with a meal.     cefpodoxime 200 MG tablet  Commonly known as:  VANTIN  Take 1 tablet (200 mg total) by mouth every 12 (twelve) hours.     cyanocobalamin 1000 MCG tablet  Take 1,000 mcg by mouth daily.     FISH OIL PO  Take 2 capsules by mouth 2 (two) times daily.     FLUoxetine 20 MG capsule  Commonly known as:  PROZAC  Take 40 mg by mouth daily.     furosemide 40 MG tablet  Commonly known as:  LASIX  Take 1 tablet (40 mg total) by mouth 2 (two) times daily.     ipratropium-albuterol 0.5-2.5 (3) MG/3ML Soln  Commonly known as:  DUONEB  Take 3 mLs by nebulization every 6 (six) hours as needed.     LACTOBACILLUS PO  Take 2 tablets by mouth 2 (two) times daily.     Magnesium Oxide 420 MG Tabs  Take 420 mg by mouth at bedtime.     memantine 10 MG tablet  Commonly known as:  NAMENDA  Take 10 mg by mouth 2 (two) times daily.     OVER THE COUNTER MEDICATION  Place 1 drop into both eyes daily as needed (dry eyes). Over the counter lubricating eye drop     potassium chloride SA 20 MEQ tablet  Commonly known as:  K-DUR,KLOR-CON  Take 2 tablets (40 mEq total) by mouth daily.     simvastatin 80 MG tablet  Commonly  known as:  ZOCOR  Take 40 mg by mouth at bedtime.     Vitamin D (Ergocalciferol) 50000 units Caps capsule  Commonly known as:  DRISDOL  Take 50,000 Units by mouth every Monday, Wednesday, and Friday.            Follow-up Information    Follow up with [facility].   Specialty:  Skilled Nursing Facility   Contact information:   [address] [place] [address] [phone]      Follow up with Ezequiel Kayser, MD.   Specialty:  Internal  Medicine   Contact information:   [address] [place] [address] [phone]        The results of significant diagnostics from this hospitalization (including imaging, microbiology, ancillary and laboratory) are listed below for reference.     Microbiology: Recent Results (from the past 240 hour(s))  Culture, blood (Routine x 2)     Status: None (Preliminary result)   Collection Time: 02/28/16  7:37 AM  Result Value Ref Range Status   Specimen Description BLOOD LEFT ANTECUBITAL  Final   Special Requests BOTTLES DRAWN AEROBIC AND ANAEROBIC  5CC  Final   Culture NO GROWTH 3 DAYS  Final   Report Status PENDING  Incomplete  Culture, blood (Routine x 2)     Status: None (Preliminary result)   Collection Time: 02/28/16  8:15 AM  Result Value Ref Range Status   Specimen Description BLOOD LEFT WRIST  Final   Special Requests BOTTLES DRAWN AEROBIC AND ANAEROBIC  5CC  Final   Culture NO GROWTH 3 DAYS  Final   Report Status PENDING  Incomplete  Urine culture     Status: Abnormal   Collection Time: 02/28/16  4:37 PM  Result Value Ref Range Status   Specimen Description URINE, CLEAN CATCH  Final   Special Requests NONE  Final   Culture MULTIPLE SPECIES PRESENT, SUGGEST RECOLLECTION (A)  Final   Report Status 03/01/2016 FINAL  Final  MRSA PCR Screening     Status: None   Collection Time: 02/28/16  4:46 PM  Result Value Ref Range Status   MRSA by PCR NEGATIVE NEGATIVE Final    Labs: Basic Metabolic  Panel:  Recent Labs Lab 02/28/16 0737 02/29/16 0316 03/01/16 0710 03/02/16 0511 03/03/16 0351  NA 138 139 138 139 140  K 3.7 3.1* 3.5 3.4* 2.8*  CL 106 106 106 103 102  CO2 23 23 24 25 28   GLUCOSE 153* 98 104* 105* 99  BUN 10 9 11 12 10   CREATININE 0.97 0.93 0.78 0.80 0.86  CALCIUM 9.2 8.7* 8.8* 9.2 8.8*  MG  --   --  1.7  --   --   PHOS  --   --  1.6* 2.5  --    Liver Function Tests:  Recent Labs Lab 02/28/16 0737 02/29/16 0316  AST 39 46*  ALT 22 35  ALKPHOS 64 68  BILITOT 2.3* 2.6*  PROT 6.5 6.2*  ALBUMIN 3.4* 2.8*   CBC:  Recent Labs Lab 02/28/16 0737 02/29/16 0316 03/01/16 0710 03/03/16 0351  WBC 15.2* 11.8* 9.9 6.9  NEUTROABS 13.2*  --   --   --   HGB 12.8* 12.0* 11.2* 10.9*  HCT 38.9* 35.8* 33.7* 33.3*  MCV 94.2 93.0 92.8 93.0  PLT 206 164 157 178   BNP (last 3 results)  Recent Labs  02/28/16 0935  BNP 121.0*   SIGNED: Time coordinating discharge: 30 minutes  MAGICK-Essex Perry, MD  [facility] 03/03/2016, 10:21 AM Pager [phone]  If 7PM-7AM, please contact night-coverage [email or web address] Password [email or web address]

## 2016-03-03 NOTE — Progress Notes (Signed)
D/C sitter at bedside, plan to d/s to SNF in AM if cardiology clears.   Debbora PrestoMAGICK-Brynley Cuddeback, MD  [facility] Pager [phone]  If 7PM-7AM, please contact night-coverage [email or web address] Password [email or web address]

## 2016-03-04 LAB — BASIC METABOLIC PANEL
ANION GAP: 8 (ref 5–15)
BUN: 7 mg/dL (ref 6–20)
CO2: 29 mmol/L (ref 22–32)
Calcium: 9.1 mg/dL (ref 8.9–10.3)
Chloride: 103 mmol/L (ref 101–111)
Creatinine, Ser: 0.82 mg/dL (ref 0.61–1.24)
GFR calc Af Amer: >60 mL/min (ref >60)
Glucose, Bld: 95 mg/dL (ref 65–99)
POTASSIUM: 3.1 mmol/L — AB (ref 3.5–5.1)
SODIUM: 140 mmol/L (ref 135–145)

## 2016-03-04 LAB — CBC
HCT: 33.3 % — ABNORMAL LOW (ref 39.0–52.0)
Hemoglobin: 11.2 g/dL — ABNORMAL LOW (ref 13.0–17.0)
MCH: 31.1 pg (ref 26.0–34.0)
MCHC: 33.6 g/dL (ref 30.0–36.0)
MCV: 92.5 fL (ref 78.0–100.0)
PLATELETS: 196 10*3/uL (ref 150–400)
RBC: 3.6 MIL/uL — AB (ref 4.22–5.81)
RDW: 13.6 % (ref 11.5–15.5)
WBC: 6.7 10*3/uL (ref 4.0–10.5)

## 2016-03-04 LAB — CULTURE, BLOOD (ROUTINE X 2)
CULTURE: NO GROWTH
Culture: NO GROWTH

## 2016-03-04 LAB — MAGNESIUM: MAGNESIUM: 1.8 mg/dL (ref 1.7–2.4)

## 2016-03-04 MED ORDER — POTASSIUM CHLORIDE CRYS ER 20 MEQ PO TBCR
40.0000 meq | EXTENDED_RELEASE_TABLET | Freq: Once | ORAL | Status: AC
  Administered 2016-03-04: 40 meq via ORAL
  Filled 2016-03-04: qty 2

## 2016-03-04 NOTE — Clinical Social Work Placement (Signed)
   CLINICAL SOCIAL WORK PLACEMENT  NOTE  Date:  03/04/2016  Patient Details  Name: Steven Yang MRN: [id number] Date of Birth: 01/08/1922  Clinical Social Work is seeking post-discharge placement for this patient at the Skilled  Nursing Facility level of care (*CSW will initial, date and re-position this form in  chart as items are completed):  Yes   Patient/family provided with [place] Clinical Social Work Department's list of facilities offering this level of care within the geographic area requested by the patient (or if unable, by the patient's family).  Yes   Patient/family informed of their freedom to choose among providers that offer the needed level of care, that participate in Medicare, Medicaid or managed care program needed by the patient, have an available bed and are willing to accept the patient.  Yes   Patient/family informed of [facility]'s ownership interest in [facility] and [facility], as well as of the fact that they are under no obligation to receive care at these facilities.  PASRR submitted to EDS on       PASRR number received on       Existing PASRR number confirmed on 03/01/16     FL2 transmitted to all facilities in geographic area requested by pt/family on 03/01/16     FL2 transmitted to all facilities within larger geographic area on       Patient informed that his/her managed care company has contracts with or will negotiate with certain facilities, including the following:        Yes   Patient/family informed of bed offers received.  [facility]     Physician recommends and patient chooses bed at      [facility].  Patient to be transferred to facility by PTAR     Patient family notified on 03/04/16 of transfer.  Name of family member notified:  Darcey NoraRichard Cosper (Son)     PHYSICIAN Please sign FL2, Please prepare prescriptions     Additional Comment:     _______________________________________________ Margarito LinerSarah C Louann Hopson, LCSW 03/04/2016, 11:24 AM

## 2016-03-04 NOTE — Clinical Social Work Note (Signed)
Patient does not have auth yet. Spoke with Tresa EndoKelly, admissions coordinator [facility] and she said it was okay to go ahead and send him there. Monia Pouchetna said it is a 24-72 hour turnaround. Auth was started 48 hours ago. Tresa EndoKelly has a reference number. Family will have to private pay if patient not covered or be set up with home health. CSW called patient's wife but no answer. CSW called patient's son and made him aware. Tresa EndoKelly will also reiterate to family. Patient's other son is currently in the hospital as well and patient's wife is sick. CSW assured patient's son that has been in contact that CSW would handle transport and staff would let him know when patient left.   Charlynn CourtSarah Ivie Maese, CSW [phone]

## 2016-03-04 NOTE — Clinical Social Work Note (Signed)
CSW facilitated patient discharge including contacting patient family and facility to confirm patient discharge plans. Clinical information faxed to facility and family agreeable with plan. CSW arranged ambulance transport via PTAR to [facility]. RN to call report prior to discharge ([phone]).  CSW will sign off for now as social work intervention is no longer needed. Please consult us again if new needs arise.  Ashely Joshua, CSW [phone]   

## 2016-03-04 NOTE — Progress Notes (Addendum)
[facility]. Telemetry box removed. IV removed and site in good condition. Pt stable and waiting for transportation [facility]. [facility].   Jilda PandaBethany Denise Washburn RN

## 2016-03-04 NOTE — Progress Notes (Signed)
Pt seen and examined at bedside, please see d/c summary from 03/03/2016. Summary updated. Son Gerlene BurdockRichard updated over the phone.   Debbora PrestoMAGICK-Aleynah Rocchio, MD  [facility] Pager [phone]  If 7PM-7AM, please contact night-coverage [email or web address] Password [email or web address]

## 2016-03-04 NOTE — Care Management Important Message (Signed)
Important Message  Patient Details  Name: Steven Yang MRN: [id number] Date of Birth: March 06, 1922   Medicare Important Message Given:  Yes    Bernadette HoitShoffner, Javarie Crisp Coleman 03/04/2016, 10:16 AM

## 2016-03-04 NOTE — Progress Notes (Addendum)
Wrong patient

## 2016-04-05 ENCOUNTER — Telehealth: Payer: Self-pay | Admitting: Cardiology

## 2016-04-05 NOTE — Telephone Encounter (Signed)
Patent's wife called in and wanted to know when patient was due to see Dr. SwazilandJordan - August 1 She states he was in the hospital and is still in rehab.  No further action needed.

## 2016-04-27 ENCOUNTER — Ambulatory Visit: Payer: Medicare HMO | Admitting: Cardiology

## 2016-05-05 ENCOUNTER — Ambulatory Visit: Payer: Medicare HMO | Admitting: Podiatry

## 2016-07-16 ENCOUNTER — Ambulatory Visit: Payer: Medicare HMO | Admitting: Cardiology

## 2016-11-25 DEATH — deceased

## 2017-06-17 IMAGING — CT CT ANGIO CHEST
2 of 6 series · 18 of 36 positions shown · IV contrast (Omni 300)
Comparison: 02/28/2016 chest x-ray

CLINICAL DATA: Dementia, coronary bypass, hypertension. New onset
atrial fibrillation with respiratory distress. Acute shortness of
breath.

EXAM:
CT ANGIOGRAPHY CHEST WITH CONTRAST
TECHNIQUE: Multidetector CT imaging of the chest was performed using the
standard protocol during bolus administration of intravenous
contrast. Multiplanar CT image reconstructions and MIPs were
obtained to evaluate the vascular anatomy.
CONTRAST:  100 cc Isovue 370

[Series 6: pe thins · axial · 0.66mm/px · z∈[-287,-16]mm · 17 of 601 slices shown]
[im 29/601  lung]
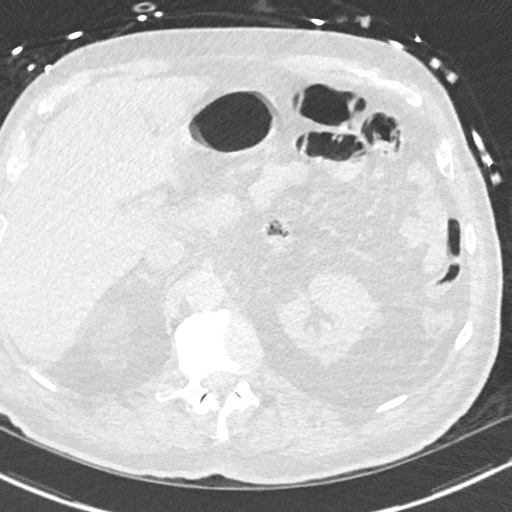
[im 58/601  mediastinal]
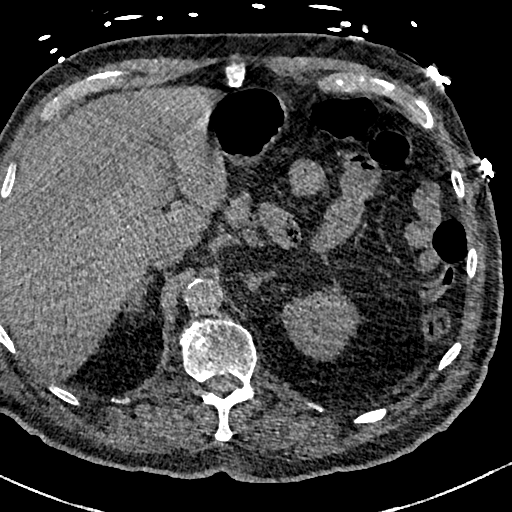
[im 86/601  lung]
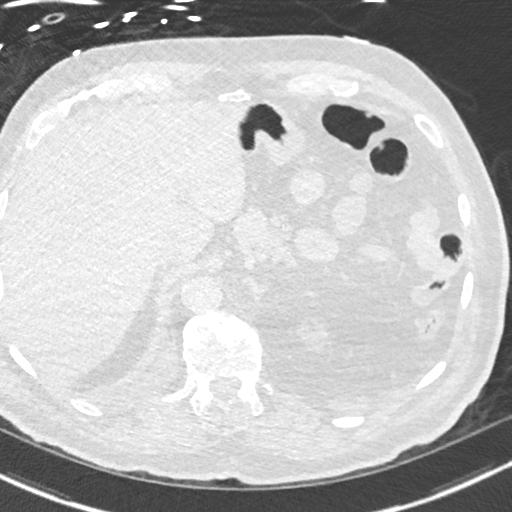
[im 143/601  mediastinal]
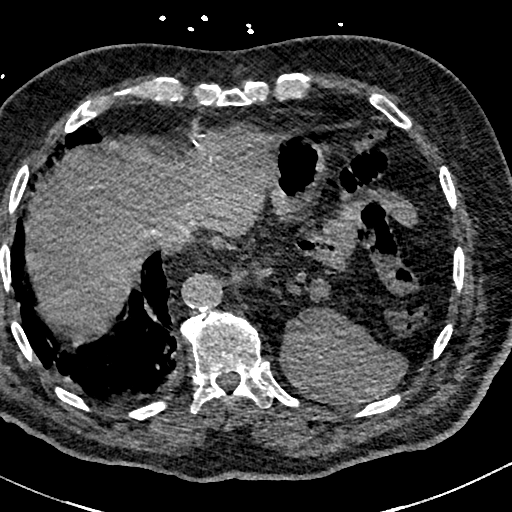
[im 172/601  lung]
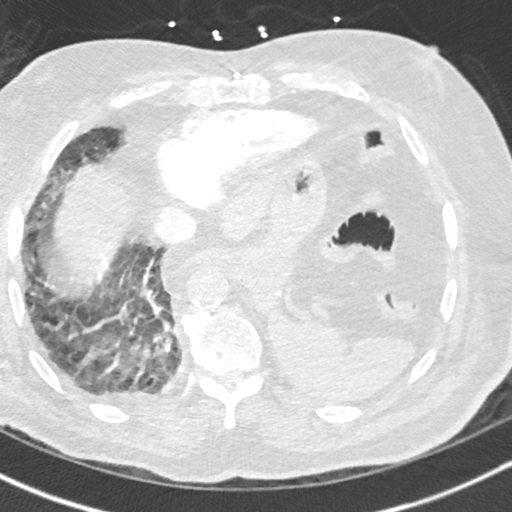
[im 201/601  mediastinal]
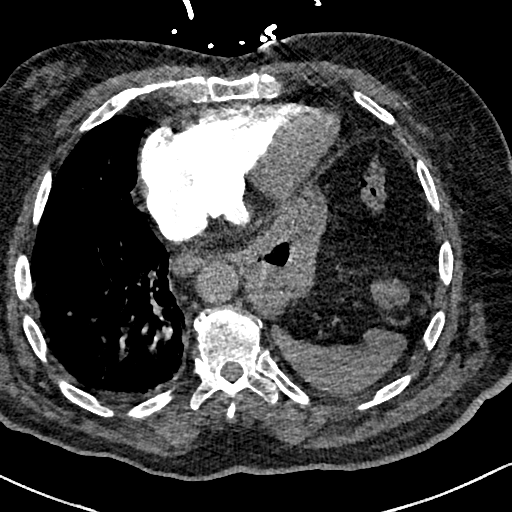
[im 229/601  lung]
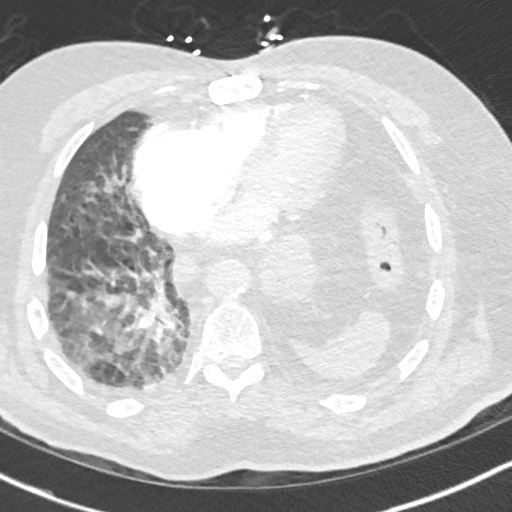
[im 258/601  mediastinal]
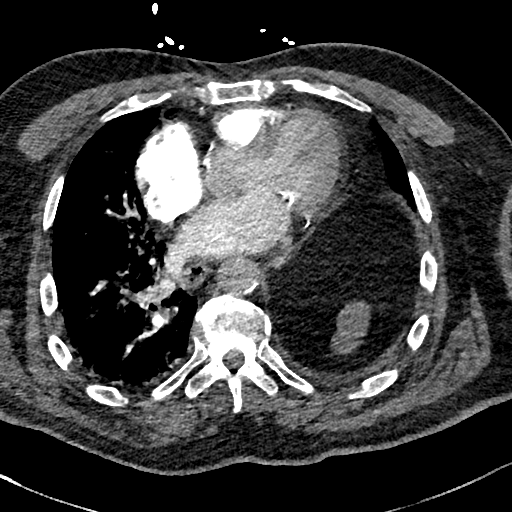
[im 315/601  lung]
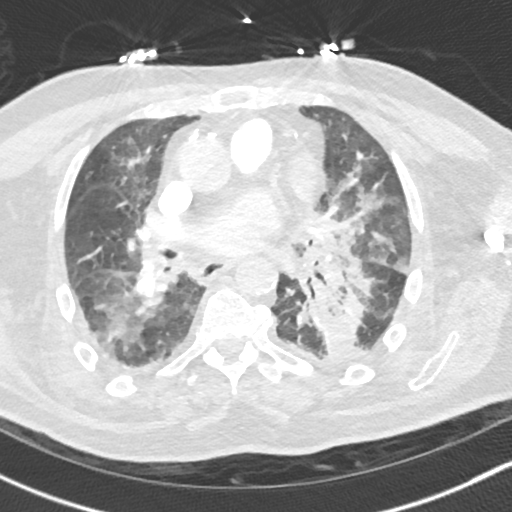
[im 343/601  mediastinal]
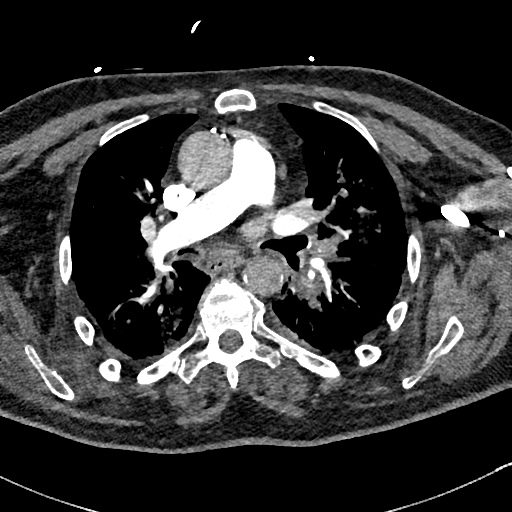
[im 372/601  lung]
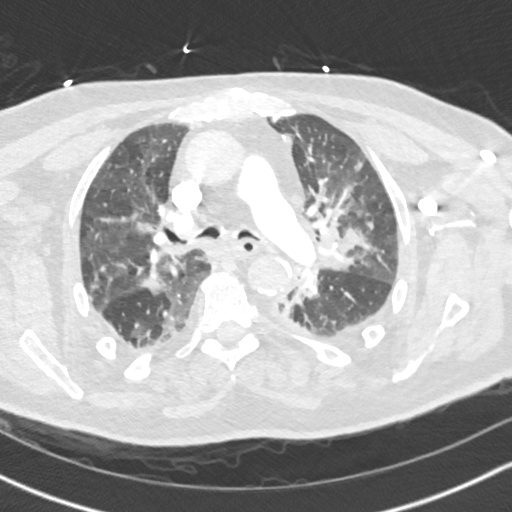
[im 401/601  mediastinal]
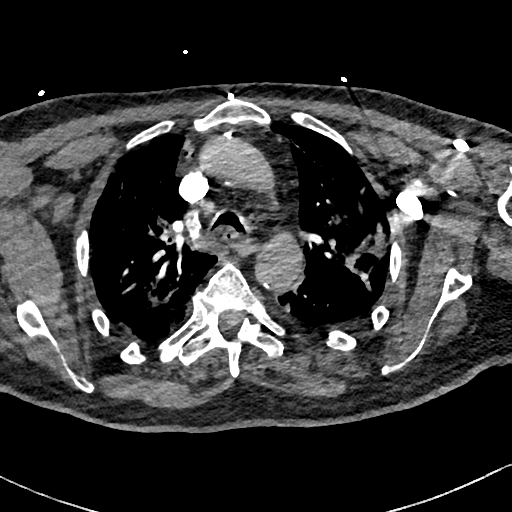
[im 429/601  lung]
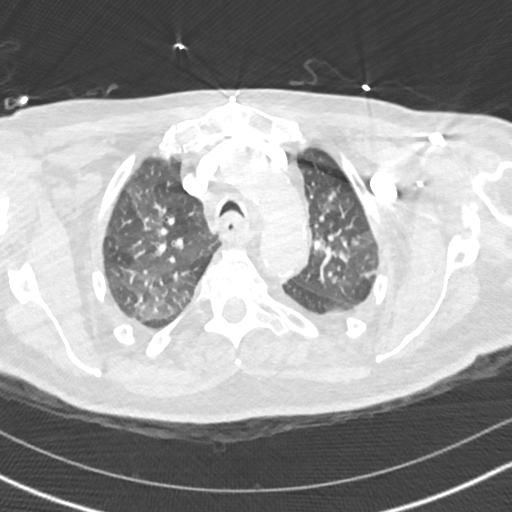
[im 458/601  mediastinal]
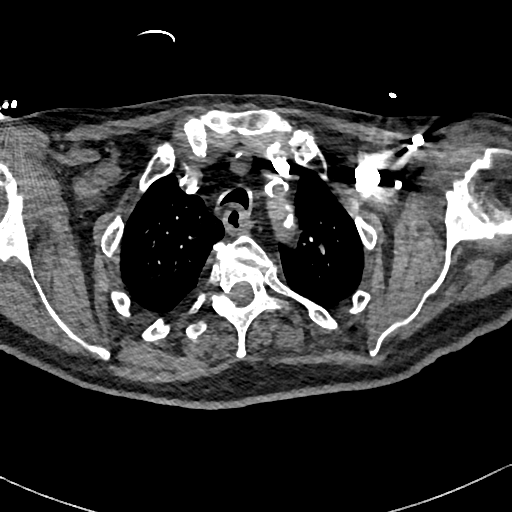
[im 515/601  lung]
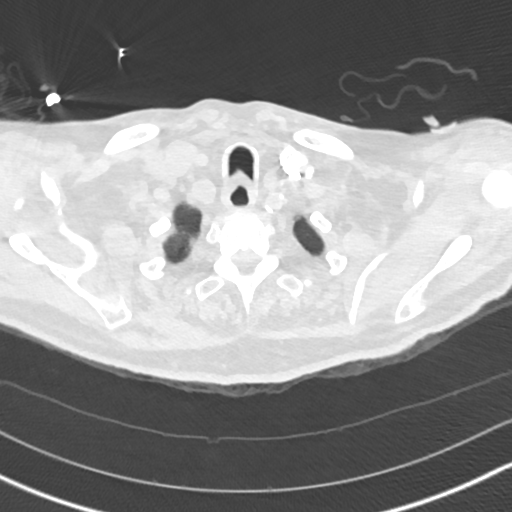
[im 543/601  mediastinal]
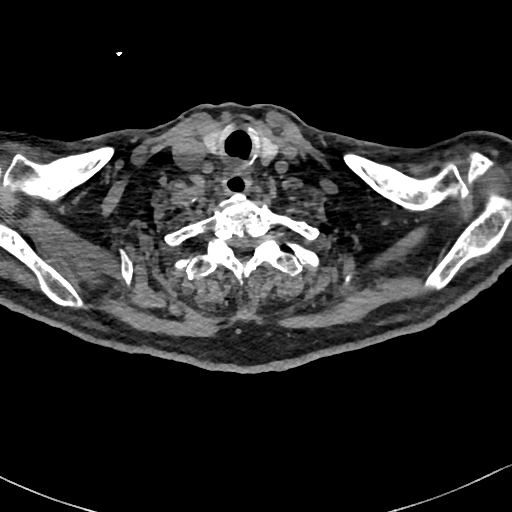
[im 572/601  lung]
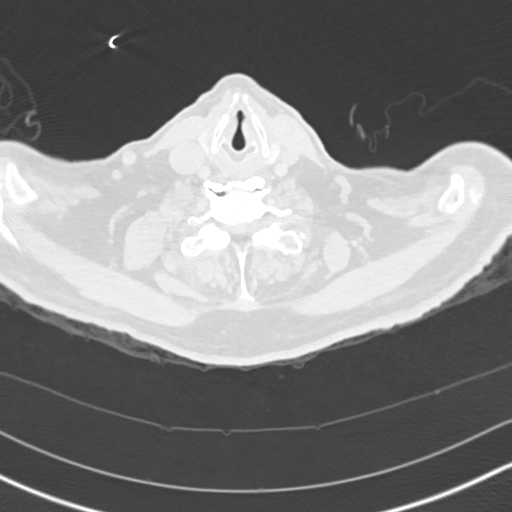

[Series 7: pe 2mm cor · coronal · 0.59mm/px · 1 of 151 slices shown]
[im 76/151  mediastinal]
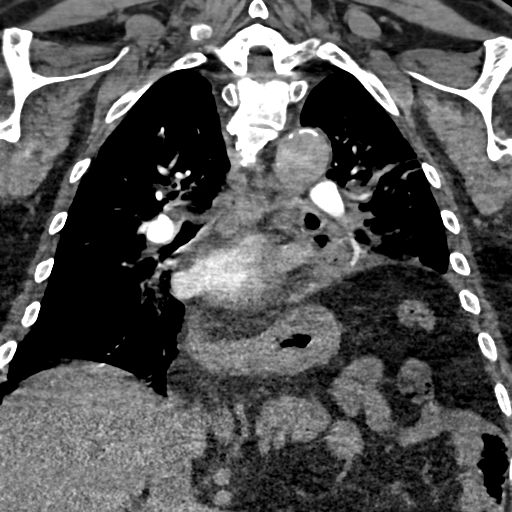

[18 of 36 positions shown; findings below may reference images not displayed]

FINDINGS: Mediastinum/Lymph Nodes: The central and proximal hilar pulmonary
arteries appear patent without significant pulmonary embolus.
Limited assessment of the small peripheral segmental and
subsegmental branches related to the central basilar opacities and
motion artifact. Calcific atherosclerosis of the thoracic aorta.
Mild thoracic aortic ectasia. Major branch vessel tortuous. Previous
coronary bypass changes noted. Native coronary atherosclerosis
evident. Mild cardiac enlargement. No pericardial effusion. No
significant adenopathy. Right hemidiaphragm is chronically elevated

Lungs/Pleura: Diffuse upper lobe and right lower lobe patchy
ground-glass opacities. There is also patchy nodular upper lobe
central airspace opacities, worse on the left. Patchy central
consolidation in the lingula as well as a left lower lobe. Pattern
is compatible with a multilobar consolidative pneumonia, worse in
the left lung. Mild superimposed ground-glass opacities could
represent a component of edema as well. Trace pleural effusions
dependently.

Upper abdomen: Punctate nonobstructing left nephrolithiasis evident.
Punctate tiny splenic granulomata noted. No other acute upper
abdominal process. Aortic atherosclerosis present.

Musculoskeletal: Degenerative changes noted of the spine diffusely.
Mild scoliotic curvature. No acute compression fracture.

Review of the MIP images confirms the above findings.
IMPRESSION: Negative for significant acute central or proximal hilar pulmonary
embolus. Limited assessment of the smaller peripheral branches to
exclude small emboli.

Multifocal patchy airspace process with dense central lingula and
left lower lobe consolidation compatible with multifocal pneumonia.

Superimposed patchy diffuse ground-glass opacities suggest a
component of the edema as well.

Chronic elevation of the left hemidiaphragm

Aortic atherosclerosis

## 2017-06-19 IMAGING — CR DG CHEST 1V PORT
1 series · 1 of 1 positions shown · non-contrast
Comparison: 02/29/2016

CLINICAL DATA: Respiratory failure, shortness of Breath

EXAM:
PORTABLE CHEST 1 VIEW

[AP]
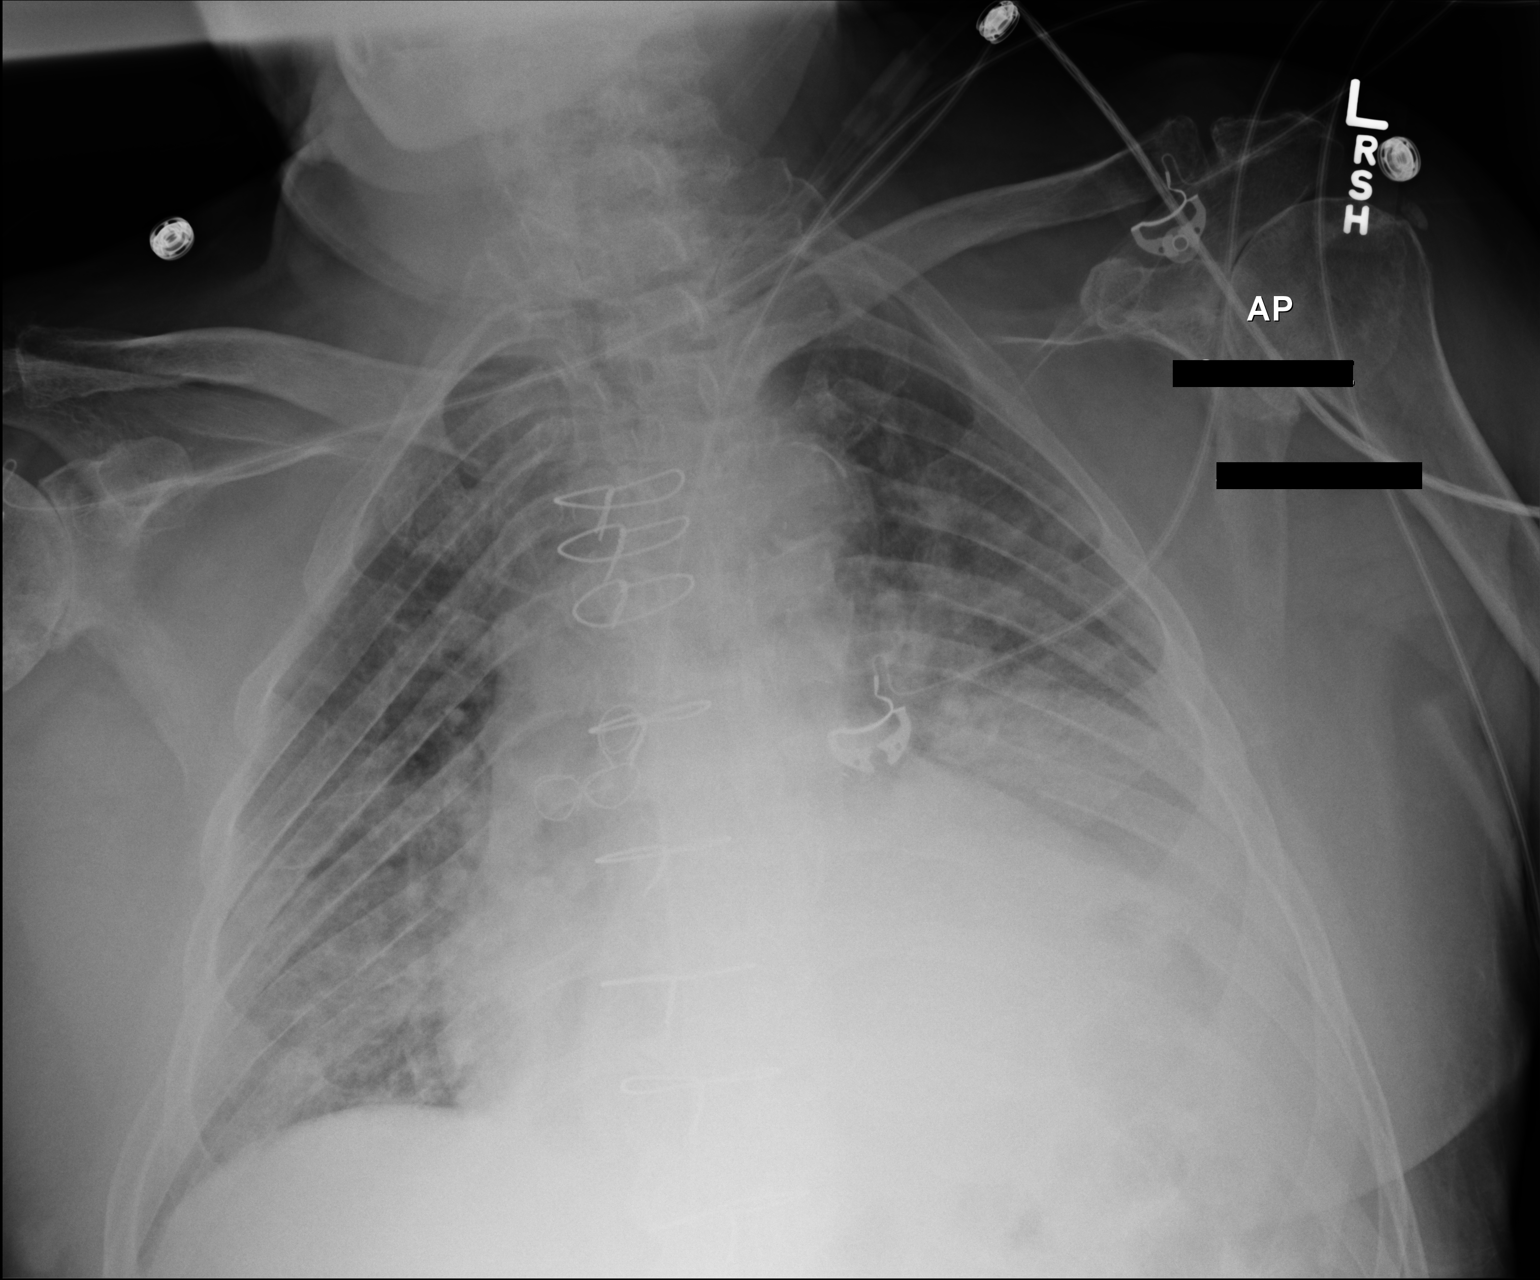

[1 of 1 positions shown; findings below may reference images not displayed]

FINDINGS: Cardiomegaly with changes of prior CABG. Diffuse bilateral airspace
disease noted, likely pulmonary edema/ CHF. Focal left lower lobe
consolidation concerning for pneumonia. No real change.
IMPRESSION: Probable mild CHF. Focal left lower lobe opacity concerning for
pneumonia. No change.
